# Patient Record
Sex: Male | Born: 1940 | Race: White | Hispanic: No | Marital: Married | Smoking: Former smoker
Health system: Southern US, Community
[De-identification: ages and names within clinical notes are randomized; demographics above are authoritative.]

## PROBLEM LIST (undated history)

## (undated) DIAGNOSIS — N3010 Interstitial cystitis (chronic) without hematuria: Secondary | ICD-10-CM

## (undated) DIAGNOSIS — R0683 Snoring: Principal | ICD-10-CM

## (undated) DIAGNOSIS — E039 Hypothyroidism, unspecified: Secondary | ICD-10-CM

## (undated) DIAGNOSIS — M6289 Other specified disorders of muscle: Secondary | ICD-10-CM

## (undated) HISTORY — DX: Other specified disorders of muscle: M62.89

## (undated) HISTORY — DX: Interstitial cystitis (chronic) without hematuria: N30.10

## (undated) HISTORY — DX: Snoring: R06.83

---

## 1968-03-07 HISTORY — PX: VASECTOMY: SHX75

## 2003-01-29 ENCOUNTER — Ambulatory Visit (HOSPITAL_COMMUNITY): Admission: RE | Admit: 2003-01-29 | Discharge: 2003-01-29 | Payer: Self-pay | Admitting: Family Medicine

## 2004-04-15 ENCOUNTER — Ambulatory Visit (HOSPITAL_COMMUNITY): Admission: RE | Admit: 2004-04-15 | Discharge: 2004-04-15 | Payer: Self-pay | Admitting: Family Medicine

## 2005-01-19 ENCOUNTER — Ambulatory Visit (HOSPITAL_COMMUNITY): Admission: RE | Admit: 2005-01-19 | Discharge: 2005-01-19 | Payer: Self-pay | Admitting: Family Medicine

## 2005-02-03 ENCOUNTER — Ambulatory Visit (HOSPITAL_COMMUNITY): Admission: RE | Admit: 2005-02-03 | Discharge: 2005-02-03 | Payer: Self-pay | Admitting: Family Medicine

## 2008-03-07 HISTORY — PX: MOUTH SURGERY: SHX715

## 2009-03-06 ENCOUNTER — Emergency Department (HOSPITAL_COMMUNITY): Admission: EM | Admit: 2009-03-06 | Discharge: 2009-03-06 | Payer: Self-pay | Admitting: Emergency Medicine

## 2009-03-07 HISTORY — PX: COLONOSCOPY: SHX174

## 2009-08-13 ENCOUNTER — Encounter: Payer: Self-pay | Admitting: Gastroenterology

## 2009-08-21 ENCOUNTER — Ambulatory Visit: Payer: Self-pay | Admitting: Gastroenterology

## 2009-08-21 ENCOUNTER — Ambulatory Visit (HOSPITAL_COMMUNITY)
Admission: RE | Admit: 2009-08-21 | Discharge: 2009-08-21 | Payer: Self-pay | Source: Home / Self Care | Admitting: Gastroenterology

## 2010-04-06 NOTE — Letter (Signed)
Summary: TRIAGE ORDER  TRIAGE ORDER   Imported By: Ave Filter 08/13/2009 13:48:05  _____________________________________________________________________  External Attachment:    Type:   Image     Comment:   External Document

## 2010-07-21 ENCOUNTER — Emergency Department (HOSPITAL_COMMUNITY)
Admission: EM | Admit: 2010-07-21 | Discharge: 2010-07-21 | Disposition: A | Discharge status: Home / Self Care | Payer: Medicare Other | Attending: Emergency Medicine | Admitting: Emergency Medicine

## 2010-07-21 DIAGNOSIS — N390 Urinary tract infection, site not specified: Secondary | ICD-10-CM | POA: Insufficient documentation

## 2010-07-21 DIAGNOSIS — Z79899 Other long term (current) drug therapy: Secondary | ICD-10-CM | POA: Insufficient documentation

## 2010-07-21 DIAGNOSIS — R319 Hematuria, unspecified: Secondary | ICD-10-CM | POA: Insufficient documentation

## 2010-07-21 DIAGNOSIS — E7800 Pure hypercholesterolemia, unspecified: Secondary | ICD-10-CM | POA: Insufficient documentation

## 2010-07-21 LAB — URINALYSIS, ROUTINE W REFLEX MICROSCOPIC
Glucose, UA: NEGATIVE mg/dL
Nitrite: POSITIVE — AB
Protein, ur: >300 mg/dL — AB
Specific Gravity, Urine: 1.025 (ref 1.005–1.030)
Urobilinogen, UA: 1 mg/dL (ref 0.0–1.0)
pH: 6.5 (ref 5.0–8.0)

## 2010-07-21 LAB — URINE MICROSCOPIC-ADD ON

## 2010-07-23 LAB — URINE CULTURE
Colony Count: 4000
Culture  Setup Time: 201205180046

## 2010-07-26 ENCOUNTER — Other Ambulatory Visit (HOSPITAL_COMMUNITY): Payer: Self-pay | Admitting: Internal Medicine

## 2010-07-27 ENCOUNTER — Other Ambulatory Visit (HOSPITAL_COMMUNITY): Payer: Self-pay | Admitting: Urology

## 2010-07-28 ENCOUNTER — Ambulatory Visit (HOSPITAL_COMMUNITY)
Admission: RE | Admit: 2010-07-28 | Discharge: 2010-07-28 | Disposition: A | Discharge status: Home / Self Care | Payer: BC Managed Care – PPO | Source: Ambulatory Visit | Attending: Internal Medicine | Admitting: Internal Medicine

## 2010-07-28 DIAGNOSIS — Q619 Cystic kidney disease, unspecified: Secondary | ICD-10-CM | POA: Insufficient documentation

## 2010-07-28 DIAGNOSIS — R3129 Other microscopic hematuria: Secondary | ICD-10-CM | POA: Insufficient documentation

## 2010-07-28 DIAGNOSIS — J984 Other disorders of lung: Secondary | ICD-10-CM | POA: Insufficient documentation

## 2010-07-28 DIAGNOSIS — K5730 Diverticulosis of large intestine without perforation or abscess without bleeding: Secondary | ICD-10-CM | POA: Insufficient documentation

## 2010-07-28 MED ORDER — IOHEXOL 300 MG/ML  SOLN
100.0000 mL | Freq: Once | INTRAMUSCULAR | Status: AC | PRN
  Administered 2010-07-28: 100 mL via INTRAVENOUS

## 2010-08-19 ENCOUNTER — Encounter (HOSPITAL_COMMUNITY): Payer: BC Managed Care – PPO

## 2010-08-19 ENCOUNTER — Other Ambulatory Visit: Payer: Self-pay | Admitting: Urology

## 2010-08-19 ENCOUNTER — Other Ambulatory Visit: Payer: Self-pay | Admitting: Infectious Diseases

## 2010-08-19 LAB — CBC
HCT: 40.1 % (ref 39.0–52.0)
Hemoglobin: 13.5 g/dL (ref 13.0–17.0)
MCH: 30.3 pg (ref 26.0–34.0)
MCHC: 33.7 g/dL (ref 30.0–36.0)
MCV: 90.1 fL (ref 78.0–100.0)
Platelets: 194 10*3/uL (ref 150–400)
RBC: 4.45 MIL/uL (ref 4.22–5.81)
RDW: 13 % (ref 11.5–15.5)
WBC: 5.7 10*3/uL (ref 4.0–10.5)

## 2010-08-19 LAB — BASIC METABOLIC PANEL
BUN: 33 mg/dL — ABNORMAL HIGH (ref 6–23)
CO2: 27 mEq/L (ref 19–32)
Calcium: 9.9 mg/dL (ref 8.4–10.5)
Chloride: 105 mEq/L (ref 96–112)
Creatinine, Ser: 1.31 mg/dL (ref 0.4–1.5)
GFR calc Af Amer: >60 mL/min (ref >60)
GFR calc non Af Amer: 54 mL/min — ABNORMAL LOW (ref >60)
Glucose, Bld: 99 mg/dL (ref 70–99)
Potassium: 3.9 mEq/L (ref 3.5–5.1)
Sodium: 139 mEq/L (ref 135–145)

## 2010-08-19 LAB — SURGICAL PCR SCREEN
MRSA, PCR: NEGATIVE
Staphylococcus aureus: POSITIVE — AB

## 2010-08-25 ENCOUNTER — Ambulatory Visit (HOSPITAL_COMMUNITY)
Admission: RE | Admit: 2010-08-25 | Discharge: 2010-08-25 | Disposition: A | Discharge status: Home / Self Care | Payer: BC Managed Care – PPO | Source: Ambulatory Visit | Attending: Urology | Admitting: Urology

## 2010-08-25 ENCOUNTER — Other Ambulatory Visit: Payer: Self-pay | Admitting: Urology

## 2010-08-25 DIAGNOSIS — Z01812 Encounter for preprocedural laboratory examination: Secondary | ICD-10-CM | POA: Insufficient documentation

## 2010-08-25 DIAGNOSIS — Z01810 Encounter for preprocedural cardiovascular examination: Secondary | ICD-10-CM | POA: Insufficient documentation

## 2010-08-25 DIAGNOSIS — R310 Gross hematuria: Secondary | ICD-10-CM | POA: Insufficient documentation

## 2010-08-25 DIAGNOSIS — N3010 Interstitial cystitis (chronic) without hematuria: Secondary | ICD-10-CM | POA: Insufficient documentation

## 2010-08-25 NOTE — H&P (Signed)
NAME:  ARTHOR, GORTER NO.:  [id number]  MEDICAL RECORD NO.:  [id number]  LOCATION:  DAY                           FACILITY:  [facility]  PHYSICIAN:  Dennie Maizes, M.D.   DATE OF BIRTH:  [birth year]  DATE OF ADMISSION:  08/25/2010 DATE OF DISCHARGE:  LH                             HISTORY & PHYSICAL   He is coming to the short-stay centre on August 25, 2010.  CHIEF COMPLAINT:  Intermittent gross hematuria.  HISTORY OF PRESENT ILLNESS:  This 70 year old male is referred to me by Dr. Janna Arch for evaluation of intermittent gross hematuria.  The patient experienced hematuria about a month ago.  He was passing blood clots in the urine.  He went to the emergency room at [facility] on Jul 21, 2010.  Urinalysis was suggestive of urinary tract infection.  Urine culture and sensitivity was however negative.  The patient was treated with a course of Cipro.  He was referred to me in office for further evaluation and management.  The patient noticed hematuria for a few days, and he was passing blood clots in the urine.  He did not have any voiding difficulty.  He has urinary frequency times 4-5 and nocturia times 1-2.  There is no history of hematuria, urolithiasis in the past.  PAST MEDICAL HISTORY: 1. Elevated cholesterol. 2. Degenerative disk disease. 3. Chronic right shoulder pain status post left hand surgery in 1949     and 2011. 4. Status post appendectomy in 1943. 5. Status post vasectomy in 1970.  MEDICATIONS:  Pravastatin, tamsulosin, naproxen, and Synthroid.  ALLERGIES:  PENICILLIN and SULFA.  FAMILY HISTORY:  Positive for heart disease, pancreatic cancer, cerebrovascular accident, celiac disease, and intestinal cancer.  SOCIAL HISTORY:  The patient does not smoke.  He drinks socially.  REVIEW OF SYSTEMS:  Positive for drug allergy to PENICILLIN and SULFA, high blood pressure, elevated cholesterol, neck pain due to degenerative disk disease,  painful urination with urinary frequency.  PHYSICAL EXAMINATION:  VITAL SIGNS:  Height 5 feet 10 inches, weight 173 pounds. HEAD, EYES, EAR, NOSE, AND THROAT:  Normal. LUNGS:  Clear to auscultation. HEART:  Regular rate and rhythm.  No murmurs. ABDOMEN:  Soft.  No palpable flat masses.  Bladder is not palpable. RECTAL:  A 30-g size benign prostate.  Urine culture and sensitivity done today in the emergency room was negative for growth.  Urine cytology revealed no evidence of malignant dysplastic cells.  Renal function:  BUN 30, creatinine 1.23.  A CT scan of abdomen and pelvis with and without contrast was done at [facility].  This revealed no evidence of renal mass or calculi.  There was no hydronephrosis.  A 6-mm size cyst was noted in the lower pole of left kidney.  No filling defects are noted in the collecting systems.  Excessive diverticulosis in the descending and sigmoid colon without evidence of diverticulitis was noted.  Left inguinal hernia containing fat was noted.  The patient also had a small lung nodule which is being followed by Dr. Janna Arch.  Cystoscopy was done in the office.  This revealed patches of erythema in the bladder and especially in the  right lateral wall.  This needs further evaluation.  IMPRESSION:  Hematuria, hemorrhagic cystitis or carcinoma in situ.  PLAN:  The patient is advised regarding the diagnosis and management options.  He is brought to the short-stay centre today for cystoscopy and bladder biopsies for further evaluation.  I explained to the patient regarding the diagnosis and operative details, possible treatment outcome, possible risks and complications and he has agreed for the procedure to be done.     Dennie Maizes, M.D.     SK/MEDQ  D:  08/24/2010  T:  08/24/2010  Job:  [id number]  cc:   Melvyn Novas, MD Fax: [phone]  Short-stay centre at [facility]  Electronically Signed by Dennie Maizes  M.D. on 08/25/2010 10:44:54 AM

## 2013-06-06 ENCOUNTER — Ambulatory Visit (INDEPENDENT_AMBULATORY_CARE_PROVIDER_SITE_OTHER): Payer: Medicare Other | Admitting: Neurology

## 2013-06-06 ENCOUNTER — Encounter (INDEPENDENT_AMBULATORY_CARE_PROVIDER_SITE_OTHER): Payer: Self-pay

## 2013-06-06 ENCOUNTER — Encounter: Payer: Self-pay | Admitting: Neurology

## 2013-06-06 VITALS — BP 128/78 | HR 72 | Resp 17 | Ht 69.5 in | Wt 174.0 lb

## 2013-06-06 DIAGNOSIS — R0683 Snoring: Secondary | ICD-10-CM

## 2013-06-06 DIAGNOSIS — R0989 Other specified symptoms and signs involving the circulatory and respiratory systems: Secondary | ICD-10-CM

## 2013-06-06 DIAGNOSIS — R0609 Other forms of dyspnea: Secondary | ICD-10-CM

## 2013-06-06 HISTORY — DX: Snoring: R06.83

## 2013-06-06 NOTE — Progress Notes (Addendum)
[facility]  Provider:  Larey Seat, M D  Referring Provider: Maricela Curet, MD Primary Care Physician:  Jason Curet, MD  Chief Complaint  Patient presents with  . New Evaluation    Room 11  . 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  This 73 y.o. male  Is seen here as a referral from Dr. Lorriane Singleton for a sleep evaluation.  His son -in- law is currently working from the home office. He has noted him snoring so loudly , audible  in the son in [place] office space 1 door away. His son-in-law measured the time between breathtaking and stated that it exceded 25 seconds.   Jason Singleton also reports being treated for pelvic floor dysfunction interstitial cystitis, causing him to have on average for nocturia breaks at night. His bedtime is around 10.30 and wakes up at 5 pm, takes a power nap of 20 minutes in the afternoon. His wife "kicked him out of the marital bedroom"  , due to snoring, about  20 years ago and when traveling, the couple will use separate hotel room.   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 His last panoramic X ray at his dentist's office showed damage. He has seen a chiropractor for TMJ manipulation.  He has retrognathia.   He is not drowsy while driving , but sleeps during cinema movies, when going out with his only grandson.  He watches TV and falls asleep, and struggles to stay awake during conversations. Intermittently he endorsed restless legs, which can prolong his sleep latency.  He routinely wakes up with a dry mouth which could also be related to some of her medications but is a habitual mouth breather .  He has had nasal congestion for most of the year,  he has a narrow nasion and is easily congested.   The patient has a normal body mass index, physically very active he had no recent change in weight  , that could explain the degree of sleepiness.   He  has only been on baclofen for 9-12  month, this may have caused more snoring.   Jason Singleton reports a lot of stress, he renovated his 27 th house for sale, and has been slower in doing this. He uses this as a suplementary income.     Review of Systems: Out of a complete 14 system review, the patient complains of only the following symptoms, and all other reviewed systems are negative: GDS 4 points,  FSS 24, Epworth 13 .  History   Social History  . Marital Status: Married    Spouse Name: Jason Singleton    Number of Children: 4  . Years of Education: Masters +   Occupational History  . Not on file.   Social History Main Topics  . Smoking status: Former Research scientist (life sciences)  . Smokeless tobacco: Never Used     Comment: Quit in 1967  . Alcohol Use: Yes     Comment: 3 glasses of wine weekly  . Drug Use: No     Comment: Marijuana in the past  . Sexual Activity: Not on file   Other Topics Concern  . Not on file   Social History Narrative   Patient is married Jason Singleton) and lives at home with his wife, daughter, son-in-law and grandson.   Patient is retired.   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 (Three children are adopted)   Patient drinks 3/4 cups of coffee daily.          Family History  Problem Relation Age of Onset  . Pancreatic cancer Mother   . Stroke Father   . Celiac disease Sister   . Celiac disease Sister   . Celiac disease Brother   . Heart attack Mother   . Heart attack Father     Past Medical History  Diagnosis Date  . Interstitial cystitis   . Pelvic floor dysfunction   . Primary snoring 06/06/2013    Patient's relatives witnessed thunderous snoring, and pauses in breathing during sleep. 04-2013     Past Surgical History  Procedure Laterality Date  . Vasectomy  1970  . Mouth surgery  2010    Current Outpatient Prescriptions  Medication Sig Dispense Refill  . baclofen (LIORESAL) 20 MG tablet 1 tablet 2 (two)  times daily.      Marland Kitchen ELMIRON 100 MG capsule 2 capsules 2 (two) times daily.      Marland Kitchen levothyroxine (SYNTHROID, LEVOTHROID) 75 MCG tablet 1 tablet daily.      . tamsulosin (FLOMAX) 0.4 MG CAPS capsule 1 capsule 2 (two) times daily.       No current facility-administered medications for this visit.    Allergies as of 06/06/2013 - Review Complete 06/06/2013  Allergen Reaction Noted  . Penicillins  06/06/2013  . Sulfa antibiotics Rash 06/06/2013    Vitals: BP 128/78  Pulse 72  Resp 17  Ht 5' 9.5" (1.765 m)  Wt 174 lb (78.926 kg)  BMI 25.34 kg/m2 Last Weight:  Wt Readings from Last 1 Encounters:  06/06/13 174 lb (78.926 kg)   Last Height:   Ht Readings from Last 1 Encounters:  06/06/13 5' 9.5" (1.765 m)    Physical exam:  General: The patient is awake, alert and appears not in acute distress. The patient is well groomed. Head: Normocephalic, atraumatic. Neck is supple. Mallampati 3, neck circumference: 15.5 inches.  Narrow nasion, left side had epistaxis, dry mouth.  Cardiovascular:  Regular rate and rhythm , without  murmurs or carotid bruit, and without distended neck veins. Respiratory: Lungs are clear to auscultation. Skin:  Without evidence of edema, or rash. No edema. Trunk: BMI is normal.  Neurologic exam : The patient is awake and alert, oriented to place and time.  Memory subjective described as intact.  There is a normal attention span & concentration ability. Speech is fluent without dysarthria, dysphonia or aphasia. Mood and affect are appropriate.  Cranial nerves: Pupils are equal and briskly reactive to light. Funduscopic exam without  evidence of pallor or edema.  Extraocular movements  in vertical and horizontal planes intact and without nystagmus. Visual fields by finger perimetry are intact. Hearing to finger rub intact.  Facial sensation intact to fine touch. Facial motor strength is symmetric and tongue and uvula move midline.  Motor exam: Normal tone and  normal muscle bulk and symmetric normal strength in all extremities.  Sensory:  Fine touch, pinprick and vibration were tested in all extremities. Proprioception is  normal.  Coordination: Rapid alternating movements in the fingers/hands is tested and normal.  Finger-to-nose maneuver tested and normal without evidence of ataxia, dysmetria or tremor.  Gait and station: Patient walks without assistive device and is able and assisted stool climb up to the exam table. Strength within normal limits. Stance is stable and normal. Tandem gait is  unfragmented. Romberg testing is normal.  Deep tendon reflexes: in the  upper and lower extremities are symmetric and intact. Babinski maneuver downgoing.   Assessment:  After physical and neurologic examination, review of laboratory studies, imaging, neurophysiology testing and pre-existing records, assessment is   1) Patient with loud snoring, witnessed apnea. OSA likely given the mouth breathing habit and snoring. 2) Epistaxis . 3) Mild retrognathia , but normal BMI. Nasal congestion.  4) nocturia   Plan:  Treatment plan and additional workup : SPLIT study order, encourage supine sleep in the lab, AHI 15 and score at 4%. This patient may benefit from dental device, in light of his retrognathia and nasal congestion.  The baclofen is used for pelvic floor dysfunction, cystitis. This medication may contribute more to snoring. Nocturia may be partially caused by OSA>

## 2013-06-06 NOTE — Patient Instructions (Signed)
Hypersomnia Hypersomnia usually brings recurrent episodes of excessive daytime sleepiness or prolonged nighttime sleep. It is different than feeling tired due to lack of or interrupted sleep at night. People with hypersomnia are compelled to nap repeatedly during the day. This is often at inappropriate times such as:  At work.  During a meal.  In conversation. These daytime naps usually provide no relief. This disorder typically affects adolescents and young adults. CAUSES  This condition may be caused by:  Another sleep disorder (such as narcolepsy or sleep apnea).  Dysfunction of the autonomic nervous system.  Drug or alcohol abuse.  A physical problem, such as:  A tumor.  Head trauma. This is damage caused by an accident.  Injury to the central nervous system.  Certain medications, or medicine withdrawal.  Medical conditions may contribute to the disorder, including:  Multiple sclerosis.  Depression.  Encephalitis.  Epilepsy.  Obesity.  Some people appear to have a genetic predisposition to this disorder. In others, there is no known cause. SYMPTOMS   Patients often have difficulty waking from a long sleep. They may feel dazed or confused.  Other symptoms may include:  Anxiety.  Increased irritation (inflammation).  Decreased energy.  Restlessness.  Slow thinking.  Slow speech.  Loss of appetite.  Hallucinations.  Memory difficulty.  Tremors, Tics.  Some patients lose the ability to function in family, social, occupational, or other settings. TREATMENT  Treatment is symptomatic in nature. Stimulants and other drugs may be used to treat this disorder. Changes in behavior may help. For example, avoid night work and social activities that delay bed time. Changes in diet may offer some relief. Patients should avoid alcohol and caffeine. PROGNOSIS  The likely outcome (prognosis) for persons with hypersomnia depends on the cause of the disorder.  The disorder itself is not life threatening. But it can have serious consequences. For example, automobile accidents can be caused by falling asleep while driving. The attacks usually continue indefinitely. Document Released: 02/11/2002 Document Revised: 05/16/2011 Document Reviewed: 01/16/2008 ExitCare Patient Information 2014 [employer].  

## 2013-07-05 ENCOUNTER — Ambulatory Visit (INDEPENDENT_AMBULATORY_CARE_PROVIDER_SITE_OTHER): Payer: Medicare Other

## 2013-07-05 VITALS — BP 123/58

## 2013-07-05 DIAGNOSIS — R0683 Snoring: Secondary | ICD-10-CM

## 2013-07-05 DIAGNOSIS — R0989 Other specified symptoms and signs involving the circulatory and respiratory systems: Secondary | ICD-10-CM

## 2013-07-05 DIAGNOSIS — R0609 Other forms of dyspnea: Secondary | ICD-10-CM

## 2013-07-09 ENCOUNTER — Encounter: Payer: Self-pay | Admitting: *Deleted

## 2013-07-09 ENCOUNTER — Telehealth: Payer: Self-pay | Admitting: Neurology

## 2013-07-09 NOTE — Telephone Encounter (Signed)
I called and left a message for the patient about his recent sleep study results. I informed the patient that the study did not reveal evidence of significant central or obstructive sleep apnea. The study did reveal hypoxema and mild UARS but only when in supine position. Dr. Brett Fairy recommend positional therapy to reduce the finding. I will fax a copy of the report to Dr. Denita Lung office and mail a copy to the patient.

## 2014-07-01 ENCOUNTER — Other Ambulatory Visit: Payer: Self-pay

## 2014-07-01 ENCOUNTER — Encounter: Payer: Self-pay | Admitting: Nurse Practitioner

## 2014-07-01 ENCOUNTER — Ambulatory Visit (INDEPENDENT_AMBULATORY_CARE_PROVIDER_SITE_OTHER): Payer: Medicare Other | Admitting: Nurse Practitioner

## 2014-07-01 VITALS — BP 118/67 | HR 70 | Temp 97.4°F | Ht 70.0 in | Wt 174.8 lb

## 2014-07-01 DIAGNOSIS — Z85038 Personal history of other malignant neoplasm of large intestine: Secondary | ICD-10-CM

## 2014-07-01 DIAGNOSIS — Z800 Family history of malignant neoplasm of digestive organs: Secondary | ICD-10-CM | POA: Diagnosis not present

## 2014-07-01 DIAGNOSIS — K5730 Diverticulosis of large intestine without perforation or abscess without bleeding: Secondary | ICD-10-CM | POA: Diagnosis not present

## 2014-07-01 MED ORDER — PEG-KCL-NACL-NASULF-NA ASC-C 100 G PO SOLR: 1.0000 | ORAL | Status: DC

## 2014-07-01 NOTE — Progress Notes (Signed)
Primary Care Physician:  Maricela Curet, MD Primary Gastroenterologist:  Dr. Oneida Alar  Chief Complaint  Patient presents with  . set up TCS    HPI:   74 year old male presents for visit or a 5 year screening colonoscopy. Last colonoscopy by Dr. Oneida Alar on 08/21/2009 found rare ascending colon diverticula, rare descending colon diverticula, frequent sigmoid: Diverticula associated with hypertrophy folds. No polyps masses AVMs or inflammatory changes. Small internal hemorrhoids. Recommend screening in 5 years due to severe sigmoid diverticulosis and hypertrophy folds which increased risk for missing a polyp less than 5 mm. Of note his family history is pertinent that his sister had colon cancer at an age greater than 66.  Today he states his sister died at age 17 from colon cancer. Had had no abdominal pain, hematochezia, melena, fever, chills, unintentional weight loss. Denies any other upper or lower GI symptoms. Watches his diet closely to prevent diverticular disease.  Past Medical History  Diagnosis Date  . Interstitial cystitis   . Pelvic floor dysfunction   . Primary snoring 06/06/2013    Patient's relatives witnessed thunderous snoring, and pauses in breathing during sleep. 04-2013     Past Surgical History  Procedure Laterality Date  . Vasectomy  1970  . Mouth surgery  2010  . Colonoscopy  2011    SLF: 1. Rare ascending colon diverticula. Rare descending colon diverticula. Frequent sigmoid colon diverticula. The diverticula wer associated. with hypertrophy folds. Otherwise no polyps, masses, AVMs or inflammatory changes. 2. Small internal hemorrhoids. Otherwise, normal retroflexed view of the rectum.     Current Outpatient Prescriptions  Medication Sig Dispense Refill  . baclofen (LIORESAL) 20 MG tablet 1 tablet 2 (two) times daily.    Marland Kitchen levothyroxine (SYNTHROID, LEVOTHROID) 75 MCG tablet 1 tablet daily.    . OnabotulinumtoxinA (BOTOX IJ) Inject as directed. Treatment for  bladder sphincter as needed    . tamsulosin (FLOMAX) 0.4 MG CAPS capsule 1 capsule as needed.     Marland Kitchen ELMIRON 100 MG capsule 2 capsules 2 (two) times daily.     No current facility-administered medications for this visit.    Allergies as of 07/01/2014 - Review Complete 07/01/2014  Allergen Reaction Noted  . Penicillins  06/06/2013  . Sulfa antibiotics Rash 06/06/2013    Family History  Problem Relation Age of Onset  . Pancreatic cancer Mother   . Stroke Father   . Celiac disease Sister   . Celiac disease Sister   . Celiac disease Brother   . Heart attack Mother   . Heart attack Father     History   Social History  . Marital Status: Married    Spouse Name: [name]  . Number of Children: 4  . Years of Education: Masters +   Occupational History  . Not on file.   Social History Main Topics  . Smoking status: Former Research scientist (life sciences)  . Smokeless tobacco: Never Used     Comment: Quit in 1967  . Alcohol Use: Yes     Comment: 3 glasses of wine weekly  . Drug Use: No     Comment: Marijuana in the past  . Sexual Activity: Not on file   Other Topics Concern  . Not on file   Social History Narrative   Patient is married [name]) and lives at home with his wife, daughter, son-in-law and grandson.   Patient is retired.   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 (Three children are adopted)   Patient drinks 3/4 cups of coffee daily.          Review of Systems: General: Negative for anorexia, weight loss, fever, chills, fatigue, weakness. Eyes: Negative for vision changes.  ENT: Negative for hoarseness, difficulty swallowing. CV: Negative for chest pain, angina, palpitations, peripheral edema.  Respiratory: Negative for dyspnea at rest, cough, sputum, wheezing.  GI: See history of present illness. MS: Negative for joint pain, low back pain.  Derm: Negative for rash or itching.  Neuro: Negative for weakness, seizure, memory loss,  confusion.  Psych: Negative for anxiety, depression.  Endo: Negative for unusual weight change.  Heme: Negative for bruising or bleeding. Allergy: Negative for rash or hives.    Physical Exam: BP 118/67 mmHg  Pulse 70  Temp(Src) 97.4 F (36.3 C)  Ht 5\' 10"  (1.778 m)  Wt 174 lb 12.8 oz (79.289 kg)  BMI 25.08 kg/m2 General:   Alert and oriented. Pleasant and cooperative. Well-nourished and well-developed.  Head:  Normocephalic and atraumatic. Eyes:  Without icterus, sclera clear and conjunctiva pink.  Ears:  Normal auditory acuity. Mouth:  No deformity or lesions, oral mucosa pink. No OP edema. Neck:  Supple, without mass or thyromegaly. Lungs:  Clear to auscultation bilaterally. No wheezes, rales, or rhonchi. No distress.  Heart:  S1, S2 present without murmurs appreciated.  Abdomen:  +BS, soft, non-tender and non-distended. No HSM noted. No guarding or rebound. No masses appreciated.  Rectal:  Deferred  Msk:  Symmetrical without gross deformities. Normal posture. Extremities:  Without clubbing or edema. Neurologic:  Alert and  oriented x4;  grossly normal neurologically. Skin:  Intact without significant lesions or rashes. Cervical Nodes:  No significant cervical adenopathy. Psych:  Alert and cooperative. Normal mood and affect.     07/01/2014 11:12 AM

## 2014-07-01 NOTE — Patient Instructions (Signed)
1. We will schedule you for your procedure (colonoscopy) for you. 2. Further recommendations to be based on results are procedure.

## 2014-07-07 NOTE — Assessment & Plan Note (Signed)
Patient with extensive diverticular disease including rare ascending and descending colon diverticula. However has frequent sigmoid diverticula associated with hypertrophy folds which made him at increased risk for missing a small polyp. Last colonoscopy 5 years ago, recommended 5 year repeat due to risk of missing small polyp and hypertrophy folds of sigmoid diverticulum. Currently asymptomatic, no hematochezia, melena, fever, chills, abdominal pain, or any other red flag/warning signs or symptoms.  Proceed with colonoscopy with Dr. Oneida Alar in the near future. The risks, benefits, and alternatives have been discussed in detail with the patient. They state understanding and desire to proceed.   Patient is not on any antidiabetic medications, anticoagulants, or chronic pain medications. Social alcohol consumption, remote history of marijuana use.

## 2014-07-07 NOTE — Progress Notes (Signed)
cc'ed to pcp °

## 2014-07-07 NOTE — Assessment & Plan Note (Addendum)
73 old male with a pertinent family history of colon cancer, his sister died at age 75 from colon cancer. Last colonoscopy 5 years ago found rare ascending colon diverticula, rare descending colon diverticula, frequent sigmoid diverticula. Recommended 5 year surveillance due to severe sigmoid diverticular disease and hypertrophy folds which increase the risk for missing a small polyp. Currently asymptomatic. No red flag/warning signs or symptoms.  Proceed with colonoscopy with Dr. Oneida Alar in the near future. The risks, benefits, and alternatives have been discussed in detail with the patient. They state understanding and desire to proceed.   Patient is not on any chronic pain medicines, anticoagulants, or diabetes medications. Admits social alcohol consumption, remote marijuana use in the past.

## 2014-07-17 ENCOUNTER — Encounter: Payer: Self-pay | Admitting: Gastroenterology

## 2014-07-22 ENCOUNTER — Ambulatory Visit (HOSPITAL_COMMUNITY)
Admission: RE | Admit: 2014-07-22 | Discharge: 2014-07-22 | Disposition: A | Discharge status: Home / Self Care | Payer: Medicare Other | Source: Ambulatory Visit | Attending: Gastroenterology | Admitting: Gastroenterology

## 2014-07-22 ENCOUNTER — Encounter (HOSPITAL_COMMUNITY): Payer: Self-pay | Admitting: *Deleted

## 2014-07-22 ENCOUNTER — Encounter (HOSPITAL_COMMUNITY)
Admission: RE | Disposition: A | Discharge status: Home / Self Care | Payer: Self-pay | Source: Ambulatory Visit | Attending: Gastroenterology

## 2014-07-22 DIAGNOSIS — E039 Hypothyroidism, unspecified: Secondary | ICD-10-CM | POA: Diagnosis not present

## 2014-07-22 DIAGNOSIS — Z880 Allergy status to penicillin: Secondary | ICD-10-CM | POA: Insufficient documentation

## 2014-07-22 DIAGNOSIS — K648 Other hemorrhoids: Secondary | ICD-10-CM | POA: Diagnosis not present

## 2014-07-22 DIAGNOSIS — D125 Benign neoplasm of sigmoid colon: Secondary | ICD-10-CM

## 2014-07-22 DIAGNOSIS — Z1211 Encounter for screening for malignant neoplasm of colon: Secondary | ICD-10-CM | POA: Diagnosis not present

## 2014-07-22 DIAGNOSIS — D120 Benign neoplasm of cecum: Secondary | ICD-10-CM | POA: Insufficient documentation

## 2014-07-22 DIAGNOSIS — Z800 Family history of malignant neoplasm of digestive organs: Secondary | ICD-10-CM | POA: Insufficient documentation

## 2014-07-22 DIAGNOSIS — Z882 Allergy status to sulfonamides status: Secondary | ICD-10-CM | POA: Insufficient documentation

## 2014-07-22 DIAGNOSIS — Z9852 Vasectomy status: Secondary | ICD-10-CM | POA: Insufficient documentation

## 2014-07-22 DIAGNOSIS — K5730 Diverticulosis of large intestine without perforation or abscess without bleeding: Secondary | ICD-10-CM | POA: Diagnosis not present

## 2014-07-22 DIAGNOSIS — Z85038 Personal history of other malignant neoplasm of large intestine: Secondary | ICD-10-CM

## 2014-07-22 HISTORY — PX: COLONOSCOPY: SHX5424

## 2014-07-22 HISTORY — DX: Hypothyroidism, unspecified: E03.9

## 2014-07-22 SURGERY — COLONOSCOPY
Anesthesia: Moderate Sedation

## 2014-07-22 MED ORDER — MIDAZOLAM HCL 5 MG/5ML IJ SOLN
INTRAMUSCULAR | Status: AC
  Filled 2014-07-22: qty 10

## 2014-07-22 MED ORDER — STERILE WATER FOR IRRIGATION IR SOLN
Status: DC | PRN
  Administered 2014-07-22: 12:00:00

## 2014-07-22 MED ORDER — MEPERIDINE HCL 100 MG/ML IJ SOLN
INTRAMUSCULAR | Status: DC | PRN
  Administered 2014-07-22 (×2): 25 mg via INTRAVENOUS

## 2014-07-22 MED ORDER — MEPERIDINE HCL 100 MG/ML IJ SOLN
INTRAMUSCULAR | Status: AC
  Filled 2014-07-22: qty 2

## 2014-07-22 MED ORDER — MIDAZOLAM HCL 5 MG/5ML IJ SOLN
INTRAMUSCULAR | Status: DC | PRN
  Administered 2014-07-22: 2 mg via INTRAVENOUS
  Administered 2014-07-22: 1 mg via INTRAVENOUS

## 2014-07-22 MED ORDER — SODIUM CHLORIDE 0.9 % IV SOLN
INTRAVENOUS | Status: DC
  Administered 2014-07-22: 11:00:00 via INTRAVENOUS

## 2014-07-22 NOTE — Discharge Instructions (Signed)
You had 2 small polyps removed. You have MODERATE internal hemorrhoids and diverticulosis IN YOUR LEFT COLON.   FOLLOW A HIGH FIBER DIET. AVOID ITEMS THAT CAUSE BLOATING. SEE INFO BELOW.  YOUR BIOPSY RESULTS WILL BE AVAILABLE IN MY CHART AFTER MAY 19 AND MY OFFICE WILL CONTACT YOU IN 10-14 DAYS WITH YOUR RESULTS.   Next colonoscopy in 5-10 years.   Colonoscopy Care After Read the instructions outlined below and refer to this sheet in the next week. These discharge instructions provide you with general information on caring for yourself after you leave the hospital. While your treatment has been planned according to the most current medical practices available, unavoidable complications occasionally occur. If you have any problems or questions after discharge, call DR. Azlee Monforte, [phone].  ACTIVITY  You may resume your regular activity, but move at a slower pace for the next 24 hours.   Take frequent rest periods for the next 24 hours.   Walking will help get rid of the air and reduce the bloated feeling in your belly (abdomen).   No driving for 24 hours (because of the medicine (anesthesia) used during the test).   You may shower.   Do not sign any important legal documents or operate any machinery for 24 hours (because of the anesthesia used during the test).    NUTRITION  Drink plenty of fluids.   You may resume your normal diet as instructed by your doctor.   Begin with a light meal and progress to your normal diet. Heavy or fried foods are harder to digest and may make you feel sick to your stomach (nauseated).   Avoid alcoholic beverages for 24 hours or as instructed.    MEDICATIONS  You may resume your normal medications.   WHAT YOU CAN EXPECT TODAY  Some feelings of bloating in the abdomen.   Passage of more gas than usual.   Spotting of blood in your stool or on the toilet paper  .  IF YOU HAD POLYPS REMOVED DURING THE COLONOSCOPY:  Eat a soft diet IF  YOU HAVE NAUSEA, BLOATING, ABDOMINAL PAIN, OR VOMITING.    FINDING OUT THE RESULTS OF YOUR TEST Not all test results are available during your visit. DR. Oneida Alar WILL CALL YOU WITHIN 14 DAYS OF YOUR PROCEDUE WITH YOUR RESULTS. Do not assume everything is normal if you have not heard from DR. Cleophus Mendonsa, CALL HER OFFICE AT [phone].  SEEK IMMEDIATE MEDICAL ATTENTION AND CALL THE OFFICE: [phone] IF:  You have more than a spotting of blood in your stool.   Your belly is swollen (abdominal distention).   You are nauseated or vomiting.   You have a temperature over 101F.   You have abdominal pain or discomfort that is severe or gets worse throughout the day.  Polyps, Colon  A polyp is extra tissue that grows inside your body. Colon polyps grow in the large intestine. The large intestine, also called the colon, is part of your digestive system. It is a long, hollow tube at the end of your digestive tract where your body makes and stores stool. Most polyps are not dangerous. They are benign. This means they are not cancerous. But over time, some types of polyps can turn into cancer. Polyps that are smaller than a pea are usually not harmful. But larger polyps could someday become or may already be cancerous. To be safe, doctors remove all polyps and test them.   PREVENTION There is not one sure way to prevent  polyps. You might be able to lower your risk of getting them if you:  Eat more fruits and vegetables and less fatty food.   Do not smoke.   Avoid alcohol.   Exercise every day.   Lose weight if you are overweight.   Eating more calcium and folate can also lower your risk of getting polyps. Some foods that are rich in calcium are milk, cheese, and broccoli. Some foods that are rich in folate are chickpeas, kidney beans, and spinach.   High-Fiber Diet A high-fiber diet changes your normal diet to include more whole grains, legumes, fruits, and vegetables. Changes in the diet  involve replacing refined carbohydrates with unrefined foods. The calorie level of the diet is essentially unchanged. The Dietary Reference Intake (recommended amount) for adult males is 38 grams per day. For adult females, it is 25 grams per day. Pregnant and lactating women should consume 28 grams of fiber per day. Fiber is the intact part of a plant that is not broken down during digestion. Functional fiber is fiber that has been isolated from the plant to provide a beneficial effect in the body. PURPOSE  Increase stool bulk.   Ease and regulate bowel movements.   Lower cholesterol.  INDICATIONS THAT YOU NEED MORE FIBER  Constipation and hemorrhoids.   Uncomplicated diverticulosis (intestine condition) and irritable bowel syndrome.   Weight management.   As a protective measure against hardening of the arteries (atherosclerosis), diabetes, and cancer.   GUIDELINES FOR INCREASING FIBER IN THE DIET  Start adding fiber to the diet slowly. A gradual increase of about 5 more grams (2 slices of whole-wheat bread, 2 servings of most fruits or vegetables, or 1 bowl of high-fiber cereal) per day is best. Too rapid an increase in fiber may result in constipation, flatulence, and bloating.   Drink enough water and fluids to keep your urine clear or pale yellow. Water, juice, or caffeine-free drinks are recommended. Not drinking enough fluid may cause constipation.   Eat a variety of high-fiber foods rather than one type of fiber.   Try to increase your intake of fiber through using high-fiber foods rather than fiber pills or supplements that contain small amounts of fiber.   The goal is to change the types of food eaten. Do not supplement your present diet with high-fiber foods, but replace foods in your present diet.    INCLUDE A VARIETY OF FIBER SOURCES  Replace refined and processed grains with whole grains, canned fruits with fresh fruits, and incorporate other fiber sources. White rice,  white breads, and most bakery goods contain little or no fiber.   Brown whole-grain rice, buckwheat oats, and many fruits and vegetables are all good sources of fiber. These include: broccoli, Brussels sprouts, cabbage, cauliflower, beets, sweet potatoes, white potatoes (skin on), carrots, tomatoes, eggplant, squash, berries, fresh fruits, and dried fruits.   Cereals appear to be the richest source of fiber. Cereal fiber is found in whole grains and bran. Bran is the fiber-rich outer coat of cereal grain, which is largely removed in refining. In whole-grain cereals, the bran remains. In breakfast cereals, the largest amount of fiber is found in those with "bran" in their names. The fiber content is sometimes indicated on the label.   You may need to include additional fruits and vegetables each day.   In baking, for 1 cup white flour, you may use the following substitutions:   1 cup whole-wheat flour minus 2 tablespoons.   1/2 cup  white flour plus 1/2 cup whole-wheat flour.   Diverticulosis Diverticulosis is a common condition that develops when small pouches (diverticula) form in the wall of the colon. The risk of diverticulosis increases with age. It happens more often in people who eat a low-fiber diet. Most individuals with diverticulosis have no symptoms. Those individuals with symptoms usually experience belly (abdominal) pain, constipation, or loose stools (diarrhea).  HOME CARE INSTRUCTIONS  Increase the amount of fiber in your diet as directed by your caregiver or dietician. This may reduce symptoms of diverticulosis.   Drink at least 6 to 8 glasses of water each day to prevent constipation.   Try not to strain when you have a bowel movement.   Avoiding nuts and seeds to prevent complications is NOT NEEDED.t  FOODS HAVING HIGH FIBER CONTENT INCLUDE:  Fruits. Apple, peach, pear, tangerine, raisins, prunes.   Vegetables. Brussels sprouts, asparagus, broccoli, cabbage, carrot,  cauliflower, romaine lettuce, spinach, summer squash, tomato, winter squash, zucchini.   Starchy Vegetables. Baked beans, kidney beans, lima beans, split peas, lentils, potatoes (with skin).   Grains. Whole wheat bread, brown rice, bran flake cereal, plain oatmeal, white rice, shredded wheat, bran muffins.    SEEK IMMEDIATE MEDICAL CARE IF:  You develop increasing pain or severe bloating.   You have an oral temperature above 101F.   You develop vomiting or bowel movements that are bloody or black.   Hemorrhoids Hemorrhoids are dilated (enlarged) veins around the rectum. Sometimes clots will form in the veins. This makes them swollen and painful. These are called thrombosed hemorrhoids. Causes of hemorrhoids include:  Constipation.   Straining to have a bowel movement.   HEAVY LIFTING  HOME CARE INSTRUCTIONS  Eat a well balanced diet and drink 6 to 8 glasses of water every day to avoid constipation. You may also use a bulk laxative.   Avoid straining to have bowel movements.   Keep anal area dry and clean.   Do not use a donut shaped pillow or sit on the toilet for long periods. This increases blood pooling and pain.   Move your bowels when your body has the urge; this will require less straining and will decrease pain and pressure.

## 2014-07-22 NOTE — Progress Notes (Signed)
REVIEWED-NO ADDITIONAL RECOMMENDATIONS. 

## 2014-07-22 NOTE — H&P (Signed)
Primary Care Physician:  Maricela Curet, MD Primary Gastroenterologist:  Dr. Oneida Alar  Pre-Procedure History & Physical: HPI:  Jason Singleton is a 74 y.o. male here for FAMILY Hx COLON CA-sister HAD COLON CA AGE > 70.  Past Medical History  Diagnosis Date  . Interstitial cystitis   . Pelvic floor dysfunction   . Primary snoring 06/06/2013    Patient's relatives witnessed thunderous snoring, and pauses in breathing during sleep. 04-2013   . Hypothyroidism     Past Surgical History  Procedure Laterality Date  . Vasectomy  1970  . Mouth surgery  2010  . Colonoscopy  2011    SLF: 1. Rare ascending colon diverticula. Rare descending colon diverticula. Frequent sigmoid colon diverticula. The diverticula wer associated. with hypertrophy folds. Otherwise no polyps, masses, AVMs or inflammatory changes. 2. Small internal hemorrhoids. Otherwise, normal retroflexed view of the rectum.     Prior to Admission medications   Medication Sig Start Date End Date Taking? Authorizing Provider  baclofen (LIORESAL) 20 MG tablet 1 tablet 2 (two) times daily. 03/29/13  Yes Historical Provider, MD  levothyroxine (SYNTHROID, LEVOTHROID) 75 MCG tablet 1 tablet daily. 04/20/13  Yes Historical Provider, MD  OnabotulinumtoxinA (BOTOX IJ) Inject as directed. Treatment for bladder sphincter as needed   Yes Historical Provider, MD  peg 3350 powder (MOVIPREP) 100 G SOLR Take 1 kit (200 g total) by mouth as directed. 07/01/14  Yes Danie Binder, MD  tamsulosin (FLOMAX) 0.4 MG CAPS capsule 1 capsule as needed.  06/02/13   Historical Provider, MD    Allergies as of 07/01/2014 - Review Complete 07/01/2014  Allergen Reaction Noted  . Penicillins  06/06/2013  . Sulfa antibiotics Rash 06/06/2013    Family History  Problem Relation Age of Onset  . Pancreatic cancer Mother   . Stroke Father   . Celiac disease Sister   . Celiac disease Sister   . Celiac disease Brother   . Heart attack Mother   . Heart attack  Father     History   Social History  . Marital Status: Married    Spouse Name: Jason Singleton  . Number of Children: 4  . Years of Education: Masters +   Occupational History  . Not on file.   Social History Main Topics  . Smoking status: Former Research scientist (life sciences)  . Smokeless tobacco: Never Used     Comment: Quit in 1967  . Alcohol Use: Yes     Comment: 3 glasses of wine weekly  . Drug Use: No     Comment: Marijuana in the past  . Sexual Activity: Not on file   Other Topics Concern  . Not on file   Social History Narrative   Patient is married Jason Singleton) and lives at home with his wife, daughter, son-in-law and grandson.   Patient is retired.   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 (Three children are adopted)   Patient drinks 3/4 cups of coffee daily.          Review of Systems: See HPI, otherwise negative ROS   Physical Exam: BP 122/76 mmHg  Pulse 74  Temp(Src) 97.6 F (36.4 C) (Oral)  Resp 18  SpO2 98% General:   Alert,  pleasant and cooperative in NAD Head:  Normocephalic and atraumatic. Neck:  Supple; Lungs:  Clear throughout to auscultation.    Heart:  Regular rate and rhythm. Abdomen:  Soft, nontender and nondistended. Normal bowel sounds,  without guarding, and without rebound.   Neurologic:  Alert and  oriented x4;  grossly normal neurologically.  Impression/Plan:    FAMILY Hx COLON CA-sister HAD COLON CA AGE > 60.  Plan: 1. TCS TODAY

## 2014-07-22 NOTE — Op Note (Signed)
[facility] [address] [place], [address]   COLONOSCOPY PROCEDURE REPORT  PATIENT: Jason, Singleton  MR#: [id number] BIRTHDATE: November 17, 1940 , 53  yrs. old GENDER: male ENDOSCOPIST: Danie Binder, MD REFERRED WL:[id number] Cindie Laroche, M.D. PROCEDURE DATE:  2014/08/04 PROCEDURE:   Colonoscopy with cold biopsy polypectomy INDICATIONS:patient's immediate family history of colon cancer.-SISTER AGE > 60(DECEASED) MEDICATIONS: Demerol 50 mg IV and Versed 3 mg IV  DESCRIPTION OF PROCEDURE:    Physical exam was performed.  Informed consent was obtained from the patient after explaining the benefits, risks, and alternatives to procedure.  The patient was connected to monitor and placed in left lateral position. Continuous oxygen was provided by nasal cannula and IV medicine administered through an indwelling cannula.  After administration of sedation and rectal exam, the patients rectum was intubated and the EC-3890Li ([id number])  colonoscope was advanced under direct visualization to the cecum.  The scope was removed slowly by carefully examining the color, texture, anatomy, and integrity mucosa on the way out.  The patient was recovered in endoscopy and discharged home in satisfactory condition.    COLON FINDINGS: Two sessile polyps ranging from 2 to 22mm in size were found in the sigmoid colon and at the cecum.  A polypectomy was performed with cold forceps.  , There was moderate diverticulosis noted in the sigmoid colon and descending colon with associated muscular hypertrophy and tortuosity.  , and Moderate sized internal hemorrhoids were found.  PREP QUALITY: good.  CECAL W/D TIME: 14       minutes COMPLICATIONS: None  ENDOSCOPIC IMPRESSION: 1.   Two COLON POLYPS REMOVED 2.   Moderate diverticulosis in the sigmoid colon and descending colon 3.   Moderate sized internal hemorrhoids  RECOMMENDATIONS: FOLLOW A HIGH FIBER DIET.  AVOID ITEMS THAT CAUSE  BLOATING. AWAIT BIOPSY RESULTS. Next colonoscopy in 5-10 years.       eSigned:  Danie Binder, MD 08/04/14 3:13 PM    CPT CODES: ICD CODES:  The ICD and CPT codes recommended by this software are interpretations from the data that the clinical staff has captured with the software.  The verification of the translation of this report to the ICD and CPT codes and modifiers is the sole responsibility of the health care institution and practicing physician where this report was generated.  [employer]. will not be held responsible for the validity of the ICD and CPT codes included on this report.  AMA assumes no liability for data contained or not contained herein. CPT is a Designer, television/film set of the [employer].

## 2014-07-23 ENCOUNTER — Encounter (HOSPITAL_COMMUNITY): Payer: Self-pay | Admitting: Gastroenterology

## 2014-07-24 ENCOUNTER — Telehealth: Payer: Self-pay | Admitting: Gastroenterology

## 2014-07-24 ENCOUNTER — Encounter: Payer: Self-pay | Admitting: Gastroenterology

## 2014-07-24 NOTE — Telephone Encounter (Signed)
Please call pt. HE had simple adenomas removed from HIS colon.   FOLLOW A HIGH FIBER DIET.  NEXT TCS IN 5-10 YEARS IF THE BENEFITS OUTWEIGH THE RISKS.

## 2014-07-24 NOTE — Telephone Encounter (Signed)
LMOM to call.

## 2014-07-24 NOTE — Telephone Encounter (Signed)
REMINDER IN EPIC °

## 2014-07-24 NOTE — Telephone Encounter (Signed)
Pt is aware of results. 

## 2014-09-30 ENCOUNTER — Other Ambulatory Visit (HOSPITAL_COMMUNITY): Payer: Self-pay | Admitting: Family Medicine

## 2014-09-30 ENCOUNTER — Ambulatory Visit (HOSPITAL_COMMUNITY)
Admission: RE | Admit: 2014-09-30 | Discharge: 2014-09-30 | Disposition: A | Discharge status: Home / Self Care | Payer: Medicare Other | Source: Ambulatory Visit | Attending: Family Medicine | Admitting: Family Medicine

## 2014-09-30 DIAGNOSIS — M1611 Unilateral primary osteoarthritis, right hip: Secondary | ICD-10-CM

## 2014-09-30 DIAGNOSIS — M545 Low back pain: Secondary | ICD-10-CM

## 2014-10-08 ENCOUNTER — Ambulatory Visit (HOSPITAL_COMMUNITY)
Admission: RE | Admit: 2014-10-08 | Discharge: 2014-10-08 | Disposition: A | Discharge status: Home / Self Care | Payer: Medicare Other | Source: Ambulatory Visit | Attending: Family Medicine | Admitting: Family Medicine

## 2014-10-08 DIAGNOSIS — R200 Anesthesia of skin: Secondary | ICD-10-CM | POA: Diagnosis not present

## 2014-10-08 DIAGNOSIS — M4806 Spinal stenosis, lumbar region: Secondary | ICD-10-CM | POA: Insufficient documentation

## 2014-10-08 DIAGNOSIS — M5126 Other intervertebral disc displacement, lumbar region: Secondary | ICD-10-CM | POA: Insufficient documentation

## 2014-10-08 DIAGNOSIS — M1288 Other specific arthropathies, not elsewhere classified, other specified site: Secondary | ICD-10-CM | POA: Insufficient documentation

## 2014-10-08 DIAGNOSIS — M545 Low back pain: Secondary | ICD-10-CM | POA: Insufficient documentation

## 2014-10-08 DIAGNOSIS — M5136 Other intervertebral disc degeneration, lumbar region: Secondary | ICD-10-CM | POA: Insufficient documentation

## 2014-10-15 ENCOUNTER — Other Ambulatory Visit: Payer: Self-pay | Admitting: Neurosurgery

## 2014-10-15 DIAGNOSIS — M5126 Other intervertebral disc displacement, lumbar region: Secondary | ICD-10-CM

## 2014-10-16 ENCOUNTER — Ambulatory Visit
Admission: RE | Admit: 2014-10-16 | Discharge: 2014-10-16 | Disposition: A | Discharge status: Home / Self Care | Payer: Medicare Other | Source: Ambulatory Visit | Attending: Neurosurgery | Admitting: Neurosurgery

## 2014-10-16 ENCOUNTER — Other Ambulatory Visit: Payer: Self-pay | Admitting: Neurosurgery

## 2014-10-16 VITALS — BP 143/83 | HR 80

## 2014-10-16 DIAGNOSIS — M5126 Other intervertebral disc displacement, lumbar region: Secondary | ICD-10-CM

## 2014-10-16 DIAGNOSIS — M5137 Other intervertebral disc degeneration, lumbosacral region: Secondary | ICD-10-CM

## 2014-10-16 MED ORDER — METHYLPREDNISOLONE ACETATE 40 MG/ML INJ SUSP (RADIOLOG
120.0000 mg | Freq: Once | INTRAMUSCULAR | Status: AC
  Administered 2014-10-16: 120 mg via EPIDURAL

## 2014-10-16 MED ORDER — IOHEXOL 180 MG/ML  SOLN
1.0000 mL | Freq: Once | INTRAMUSCULAR | Status: DC | PRN
  Administered 2014-10-16: 1 mL via EPIDURAL

## 2014-10-16 NOTE — Discharge Instructions (Signed)

## 2014-11-13 ENCOUNTER — Other Ambulatory Visit: Payer: Self-pay | Admitting: Neurosurgery

## 2014-11-13 DIAGNOSIS — M549 Dorsalgia, unspecified: Secondary | ICD-10-CM

## 2014-11-20 ENCOUNTER — Other Ambulatory Visit: Payer: Self-pay | Admitting: Neurosurgery

## 2014-11-20 ENCOUNTER — Ambulatory Visit
Admission: RE | Admit: 2014-11-20 | Discharge: 2014-11-20 | Disposition: A | Discharge status: Home / Self Care | Payer: Medicare Other | Source: Ambulatory Visit | Attending: Neurosurgery | Admitting: Neurosurgery

## 2014-11-20 DIAGNOSIS — M549 Dorsalgia, unspecified: Secondary | ICD-10-CM

## 2014-11-20 DIAGNOSIS — M5137 Other intervertebral disc degeneration, lumbosacral region: Secondary | ICD-10-CM

## 2014-11-20 MED ORDER — METHYLPREDNISOLONE ACETATE 40 MG/ML INJ SUSP (RADIOLOG
120.0000 mg | Freq: Once | INTRAMUSCULAR | Status: AC
  Administered 2014-11-20: 120 mg via EPIDURAL

## 2014-11-20 MED ORDER — IOHEXOL 180 MG/ML  SOLN
1.0000 mL | Freq: Once | INTRAMUSCULAR | Status: DC | PRN
  Administered 2014-11-20: 1 mL via EPIDURAL

## 2014-11-20 NOTE — Discharge Instructions (Signed)

## 2015-01-08 ENCOUNTER — Other Ambulatory Visit: Payer: Self-pay | Admitting: Neurosurgery

## 2015-01-08 DIAGNOSIS — M549 Dorsalgia, unspecified: Secondary | ICD-10-CM

## 2015-01-09 ENCOUNTER — Ambulatory Visit
Admission: RE | Admit: 2015-01-09 | Discharge: 2015-01-09 | Disposition: A | Discharge status: Home / Self Care | Payer: Medicare Other | Source: Ambulatory Visit | Attending: Neurosurgery | Admitting: Neurosurgery

## 2015-01-09 DIAGNOSIS — M549 Dorsalgia, unspecified: Secondary | ICD-10-CM

## 2015-01-09 MED ORDER — METHYLPREDNISOLONE ACETATE 40 MG/ML INJ SUSP (RADIOLOG: 120.0000 mg | Freq: Once | INTRAMUSCULAR | Status: DC

## 2015-01-09 MED ORDER — IOHEXOL 180 MG/ML  SOLN: 1.0000 mL | Freq: Once | INTRAMUSCULAR | Status: DC | PRN

## 2015-03-08 HISTORY — PX: OTHER SURGICAL HISTORY: SHX169

## 2016-01-05 ENCOUNTER — Ambulatory Visit (HOSPITAL_COMMUNITY)
Admission: RE | Admit: 2016-01-05 | Discharge: 2016-01-05 | Disposition: A | Discharge status: Home / Self Care | Payer: Medicare Other | Source: Ambulatory Visit | Attending: Family Medicine | Admitting: Family Medicine

## 2016-01-05 ENCOUNTER — Other Ambulatory Visit (HOSPITAL_COMMUNITY): Payer: Self-pay | Admitting: Family Medicine

## 2016-01-05 DIAGNOSIS — M7502 Adhesive capsulitis of left shoulder: Secondary | ICD-10-CM

## 2016-01-05 DIAGNOSIS — M25512 Pain in left shoulder: Secondary | ICD-10-CM | POA: Diagnosis present

## 2016-01-05 DIAGNOSIS — I700 Atherosclerosis of aorta: Secondary | ICD-10-CM | POA: Insufficient documentation

## 2016-01-05 DIAGNOSIS — M19012 Primary osteoarthritis, left shoulder: Secondary | ICD-10-CM | POA: Insufficient documentation

## 2016-03-25 ENCOUNTER — Encounter: Payer: Self-pay | Admitting: Orthopedic Surgery

## 2016-03-25 ENCOUNTER — Ambulatory Visit (INDEPENDENT_AMBULATORY_CARE_PROVIDER_SITE_OTHER): Payer: Medicare Other | Admitting: Orthopedic Surgery

## 2016-03-25 VITALS — BP 156/90 | HR 83 | Wt 178.0 lb

## 2016-03-25 DIAGNOSIS — M7552 Bursitis of left shoulder: Secondary | ICD-10-CM

## 2016-03-25 DIAGNOSIS — M47812 Spondylosis without myelopathy or radiculopathy, cervical region: Secondary | ICD-10-CM | POA: Diagnosis not present

## 2016-03-25 NOTE — Progress Notes (Signed)
Patient ID: Jason Singleton, male   DOB: [birth year], 76 y.o.   MRN: [id number]  Chief Complaint  Patient presents with  . Shoulder Pain    LEFT SHOULDER PAIN    HPI Jason Singleton is a 76 y.o. male.  14 male with history of degenerative disc disease followed by chiropractor presents with shoulder discomfort on the left side associated with nocturnal symptoms painful [place] elevation and bilateral shoulder pain some next stiffness and neck pain but no gait disturbance or numbness and tingling in his upper extremities  Pain comes and goes intermittent in intensity seems to be aggravated by position  Review of Systems Review of Systems  Constitutional: Negative for fever.  Musculoskeletal: Negative for gait problem.  Neurological: Negative for weakness and numbness.    Past Medical History:  Diagnosis Date  . Hypothyroidism   . Interstitial cystitis   . Pelvic floor dysfunction   . Primary snoring 06/06/2013   Patient's relatives witnessed thunderous snoring, and pauses in breathing during sleep. 04-2013     Past Surgical History:  Procedure Laterality Date  . COLONOSCOPY  2011   SLF: 1. Rare ascending colon diverticula. Rare descending colon diverticula. Frequent sigmoid colon diverticula. The diverticula wer associated. with hypertrophy folds. Otherwise no polyps, masses, AVMs or inflammatory changes. 2. Small internal hemorrhoids. Otherwise, normal retroflexed view of the rectum.   . COLONOSCOPY N/A 07/22/2014   SIMPLE ADENOMA(2), MOD IH, L TICS  . MOUTH SURGERY  2010  . VASECTOMY  1970    Family History  Problem Relation Age of Onset  . Pancreatic cancer Mother   . Stroke Father   . Celiac disease Sister   . Celiac disease Sister   . Celiac disease Brother   . Heart attack Mother   . Heart attack Father     Social History Social History  Substance Use Topics  . Smoking status: Former Research scientist (life sciences)  . Smokeless tobacco: Never Used     Comment: Quit in 1967  . Alcohol use Yes      Comment: 3 glasses of wine weekly    Allergies  Allergen Reactions  . Penicillins Swelling    "major swelling at injection site as a teenager"  . Sulfa Antibiotics Rash    Current Outpatient Prescriptions  Medication Sig Dispense Refill  . baclofen (LIORESAL) 20 MG tablet 1 tablet 2 (two) times daily.    Marland Kitchen levothyroxine (SYNTHROID, LEVOTHROID) 75 MCG tablet 1 tablet daily.    . OnabotulinumtoxinA (BOTOX IJ) Inject as directed. Treatment for bladder sphincter as needed    . tamsulosin (FLOMAX) 0.4 MG CAPS capsule 1 capsule as needed.      No current facility-administered medications for this visit.     Physical Exam BP (!) 156/90   Pulse 83   Wt 178 lb (80.7 kg)   BMI 25.54 kg/m  Physical Exam  Constitutional: He is oriented to person, place, and time. He appears well-developed and well-nourished. No distress.  Cardiovascular: Normal rate and intact distal pulses.   Neurological: He is alert and oriented to person, place, and time.  Skin: Skin is warm and dry. No rash noted. He is not diaphoretic. No erythema. No pallor.  Psychiatric: He has a normal mood and affect. His behavior is normal. Judgment and thought content normal.   Ambulatory status normal with no assistive devices Ortho Exam   Data Reviewed Imaging of the left shoulder are independently reviewed and I interpreted these as mild degenerative changes of the acromioclavicular  joint  MRI reports And plain films of the cervical spine  CERVICAL SPINE 5 VIEWS:   Anatomic alignment without fracture or subluxation. Multilevel degenerative disc disease with disc space narrowing and endplate spur formation, greatest C5-C6. Prevertebral soft tissues normal thickness. Multilevel encroachment upon bilateral cervical neural foramina by uncovertebral spurring. C1-C2 alignment normal for degree of head rotation. Facet degenerative changes diffusely cervical spine. Lung apices clear.   IMPRESSION: Extensive  degenerative disc and facet disease changes cervical spine. Multilevel cervical neural foraminal encroachment by uncovertebral spurs. No acute abnormality.   Provider: Alisia Ferrari MRI CERVICAL SPINE WITHOUT CONTRAST:  Routine MR imaging of the cervical spine is performed without contrast.  Findings:  Vertebral body height, signal and alignment are maintained. Craniocervical junction appears normal. Size, shape and signal of the imaged spinal cord are normal. The C2-3 level is unremarkable.  At C3-4, there is posterior bony ridging with a disk osteophyte complex narrowing the ventral thecal sac. Uncovertebral disease causes mild bilateral neural foraminal narrowing.  At C4-5, again seen is posterior bony ridging with a disk osteophyte complex narrowing the ventral thecal sac. Neural foramina appear patent.  At C5-6, there is more prominent posterior disk osteophyte complex narrowing the ventral thecal sac. The spinal cord is mildly flattened. Neural foramina are moderately narrowed bilaterally.  At C6-7, there is some posterior bony ridging narrowing the ventral thecal sac. Neural foramina are mildly narrow.   C7-T1 level is unremarkable.   IMPRESSION:  Spondylosis from C3-4 to C6-7 with a C5-6 level most notably effected.   Assessment  Cervical spondylosis with no gait disturbances or neurologic symptoms are present  Left shoulder bursitis Encounter Diagnoses  Name Primary?  . Bursitis of left shoulder Yes  . Cervical spondylosis without myelopathy      Plan  HEP Inject left shoulder  Procedure note the subacromial injection shoulder left   Verbal consent was obtained to inject the  Left   Shoulder  Timeout was completed to confirm the injection site is a subacromial space of the  left  shoulder  Medication used Depo-Medrol 40 mg and lidocaine 1% 3 cc  Anesthesia was provided by ethyl chloride  The injection was performed in the left  posterior  subacromial space. After pinning the skin with alcohol and anesthetized the skin with ethyl chloride the subacromial space was injected using a 20-gauge needle. There were no complications  Sterile dressing was applied.   FU AS NEEDED

## 2016-03-25 NOTE — Patient Instructions (Signed)
DO THE EXERCISES FOR 6 WEEKS  IF THERE IS NO IMPROVEMENT PLEASE FEEL FREE TO SCHEDULE ANOTHER APPOINTMENT   Encounter Diagnoses  Name Primary?  . Bursitis of left shoulder Yes  . Cervical spondylosis without myelopathy

## 2018-07-25 ENCOUNTER — Ambulatory Visit (HOSPITAL_COMMUNITY)
Admission: RE | Admit: 2018-07-25 | Discharge: 2018-07-25 | Disposition: A | Discharge status: Home / Self Care | Payer: Medicare Other | Source: Ambulatory Visit | Attending: Family Medicine | Admitting: Family Medicine

## 2018-07-25 ENCOUNTER — Other Ambulatory Visit (HOSPITAL_COMMUNITY): Payer: Self-pay | Admitting: Family Medicine

## 2018-07-25 ENCOUNTER — Other Ambulatory Visit: Payer: Self-pay

## 2018-07-25 DIAGNOSIS — M25551 Pain in right hip: Secondary | ICD-10-CM | POA: Diagnosis present

## 2018-07-25 DIAGNOSIS — G8929 Other chronic pain: Secondary | ICD-10-CM | POA: Diagnosis present

## 2018-07-25 DIAGNOSIS — Z96642 Presence of left artificial hip joint: Secondary | ICD-10-CM | POA: Diagnosis present

## 2018-07-25 DIAGNOSIS — M25552 Pain in left hip: Secondary | ICD-10-CM

## 2018-08-27 ENCOUNTER — Other Ambulatory Visit: Payer: Self-pay

## 2018-08-27 ENCOUNTER — Other Ambulatory Visit: Payer: Medicare Other

## 2018-08-27 DIAGNOSIS — Z20822 Contact with and (suspected) exposure to covid-19: Secondary | ICD-10-CM

## 2018-08-27 NOTE — Progress Notes (Signed)
[id number] 

## 2018-08-31 ENCOUNTER — Other Ambulatory Visit (HOSPITAL_COMMUNITY): Payer: Self-pay | Admitting: Family Medicine

## 2018-08-31 DIAGNOSIS — G8929 Other chronic pain: Secondary | ICD-10-CM

## 2018-08-31 DIAGNOSIS — M25551 Pain in right hip: Secondary | ICD-10-CM

## 2018-08-31 LAB — NOVEL CORONAVIRUS, NAA: SARS-CoV-2, NAA: NOT DETECTED

## 2018-09-03 ENCOUNTER — Telehealth: Payer: Self-pay

## 2018-09-03 NOTE — Telephone Encounter (Signed)
Patient called and says he and his wife were around the same people who tested positive for covid. They both were tested on 08/30/18 and he says she tested positive and he tested negative. He asks does he need to be retested. I asked how was the test done, he says it was done with the swab all the way up his nose into his sinuses. I advised him to contact his PCP to get the advice of him, since the test was done on 08/30/18 and done adequately. He verbalized understanding.

## 2018-09-13 ENCOUNTER — Other Ambulatory Visit: Payer: Medicare Other

## 2018-09-13 ENCOUNTER — Other Ambulatory Visit: Payer: Self-pay

## 2018-09-13 DIAGNOSIS — Z20822 Contact with and (suspected) exposure to covid-19: Secondary | ICD-10-CM

## 2018-09-20 LAB — NOVEL CORONAVIRUS, NAA: SARS-CoV-2, NAA: NOT DETECTED

## 2019-03-18 ENCOUNTER — Other Ambulatory Visit: Payer: Self-pay

## 2019-03-18 ENCOUNTER — Ambulatory Visit: Payer: Medicare PPO | Attending: Internal Medicine

## 2019-03-18 DIAGNOSIS — Z20822 Contact with and (suspected) exposure to covid-19: Secondary | ICD-10-CM

## 2019-03-20 LAB — NOVEL CORONAVIRUS, NAA: SARS-CoV-2, NAA: NOT DETECTED

## 2019-05-02 ENCOUNTER — Ambulatory Visit: Payer: Medicare PPO | Attending: Internal Medicine

## 2019-05-02 DIAGNOSIS — Z23 Encounter for immunization: Secondary | ICD-10-CM

## 2019-05-02 NOTE — Progress Notes (Signed)
   Covid-19 Vaccination Clinic  Name:  Jason Singleton    MRN: [id number] DOB: 09-Jul-1940  05/02/2019  Jason Singleton was observed post Covid-19 immunization for 15 minutes without incidence. He was provided with Vaccine Information Sheet and instruction to access the V-Safe system.   Jason Singleton was instructed to call 911 with any severe reactions post vaccine: Marland Kitchen Difficulty breathing  . Swelling of your face and throat  . A fast heartbeat  . A bad rash all over your body  . Dizziness and weakness    Immunizations Administered    Name Date Dose VIS Date Route   Pfizer COVID-19 Vaccine 05/02/2019  9:18 AM 0.3 mL 02/15/2019 Intramuscular   Manufacturer: [place]   Lot: [id number]   [employer]: [id number]

## 2019-05-28 ENCOUNTER — Ambulatory Visit: Payer: Medicare PPO | Attending: Internal Medicine

## 2019-05-28 DIAGNOSIS — Z23 Encounter for immunization: Secondary | ICD-10-CM

## 2019-05-28 NOTE — Progress Notes (Signed)
   Covid-19 Vaccination Clinic  Name:  SUMMIE HOEFERT    MRN: [id number] DOB: 02/29/1941  05/28/2019  Mr. Durling was observed post Covid-19 immunization for 15 minutes without incident. He was provided with Vaccine Information Sheet and instruction to access the V-Safe system.   Mr. Eavey was instructed to call 911 with any severe reactions post vaccine: Marland Kitchen Difficulty breathing  . Swelling of face and throat  . A fast heartbeat  . A bad rash all over body  . Dizziness and weakness   Immunizations Administered    Name Date Dose VIS Date Route   Pfizer COVID-19 Vaccine 05/28/2019  8:32 AM 0.3 mL 02/15/2019 Intramuscular   Manufacturer: [place]   Lot: [place]   [place]: [id number]

## 2019-06-19 ENCOUNTER — Encounter: Payer: Self-pay | Admitting: Gastroenterology

## 2019-06-24 ENCOUNTER — Other Ambulatory Visit (HOSPITAL_COMMUNITY): Payer: Self-pay | Admitting: Family Medicine

## 2019-06-24 ENCOUNTER — Encounter: Payer: Self-pay | Admitting: Gastroenterology

## 2019-06-24 ENCOUNTER — Other Ambulatory Visit: Payer: Self-pay

## 2019-06-24 ENCOUNTER — Ambulatory Visit (HOSPITAL_COMMUNITY)
Admission: RE | Admit: 2019-06-24 | Discharge: 2019-06-24 | Disposition: A | Discharge status: Home / Self Care | Payer: Medicare PPO | Source: Ambulatory Visit | Attending: Family Medicine | Admitting: Family Medicine

## 2019-06-24 DIAGNOSIS — M19071 Primary osteoarthritis, right ankle and foot: Secondary | ICD-10-CM | POA: Insufficient documentation

## 2019-07-23 ENCOUNTER — Encounter: Payer: Self-pay | Admitting: Gastroenterology

## 2019-10-03 NOTE — Progress Notes (Signed)
Primary Care Physician:  Lucia Gaskins, MD  Primary Gastroenterologist: Formerly Dr. Oneida Alar  Chief Complaint  Patient presents with  . Colonoscopy    consult    HPI:  Jason Singleton is a 79 y.o. male here at the request of Dr. Cindie Laroche for consideration of colonoscopy.  Given his age he was brought in for discussion regarding colonoscopy.  His last colonoscopy was in 2016.  He had 2 simple adenomas removed from his colon.  Advised to consider colonoscopy in 5 to 10 years if benefits outweigh the risk.  He had a sister who died with colon cancer at age greater than 43.  BM regular. No melena, brbpr. No abdominal pain. Limited heartburn related to certain foods, TUMS helps. Diverticulitis 5 months ago. He has had intentional weight loss of 6 pounds. He is very active and trying to take care of his health.   Previously tested negative for sleep apnea. He has Interstim generator placed for pelvic floor dysfunction and urinary retention.    Current Outpatient Medications  Medication Sig Dispense Refill  . levothyroxine (SYNTHROID, LEVOTHROID) 75 MCG tablet 1 tablet daily.     No current facility-administered medications for this visit.    Allergies as of 10/04/2019 - Review Complete 10/04/2019  Allergen Reaction Noted  . Penicillins Swelling 06/06/2013  . Sulfa antibiotics Rash 06/06/2013    Past Medical History:  Diagnosis Date  . Hypothyroidism   . Interstitial cystitis   . Pelvic floor dysfunction   . Primary snoring 06/06/2013   Patient's relatives witnessed thunderous snoring, and pauses in breathing during sleep. 04-2013     Past Surgical History:  Procedure Laterality Date  . COLONOSCOPY  2011   SLF: 1. Rare ascending colon diverticula. Rare descending colon diverticula. Frequent sigmoid colon diverticula. The diverticula wer associated. with hypertrophy folds. Otherwise no polyps, masses, AVMs or inflammatory changes. 2. Small internal hemorrhoids. Otherwise, normal  retroflexed view of the rectum.   . COLONOSCOPY N/A 07/22/2014   SIMPLE ADENOMA(2), MOD IH, L TICS  . Interstim generator placement  2017  . MOUTH SURGERY  2010  . VASECTOMY  1970    Family History  Problem Relation Age of Onset  . Pancreatic cancer Mother   . Heart attack Mother   . Stroke Father   . Heart attack Father   . Celiac disease Sister   . Celiac disease Sister   . Celiac disease Brother   . Colon cancer Sister        age greater than 70    Social History   Socioeconomic History  . Marital status: Married    Spouse name: Jason Singleton  . Number of children: 4  . Years of education: Masters +  . Highest education level: Not on file  Occupational History  . Not on file  Tobacco Use  . Smoking status: Former Research scientist (life sciences)  . Smokeless tobacco: Never Used  . Tobacco comment: Quit in 1967  Substance and Sexual Activity  . Alcohol use: Yes    Comment: 3 glasses of wine weekly  . Drug use: No    Comment: Marijuana in the past  . Sexual activity: Not on file  Other Topics Concern  . Not on file  Social History Narrative   Patient is married Jason Singleton) and lives at home with his wife, daughter, son-in-law and grandson.   Patient is retired.   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 (Three children  are adopted)   Patient drinks 3/4 cups of coffee daily.         Social Determinants of Health   Financial Resource Strain:   . Difficulty of Paying Living Expenses:   Food Insecurity:   . Worried About Charity fundraiser in the Last Year:   . Arboriculturist in the Last Year:   Transportation Needs:   . Film/video editor (Medical):   Marland Kitchen Lack of Transportation (Non-Medical):   Physical Activity:   . Days of Exercise per Week:   . Minutes of Exercise per Session:   Stress:   . Feeling of Stress :   Social Connections:   . Frequency of Communication with Friends and Family:   . Frequency of Social Gatherings with  Friends and Family:   . Attends Religious Services:   . Active Member of Clubs or Organizations:   . Attends Archivist Meetings:   Marland Kitchen Marital Status:   Intimate Partner Violence:   . Fear of Current or Ex-Partner:   . Emotionally Abused:   Marland Kitchen Physically Abused:   . Sexually Abused:       ROS:  General: Negative for anorexia, weight loss, fever, chills, fatigue, weakness. Eyes: Negative for vision changes.  ENT: Negative for hoarseness, difficulty swallowing , nasal congestion. CV: Negative for chest pain, angina, palpitations, dyspnea on exertion, peripheral edema.  Respiratory: Negative for dyspnea at rest, dyspnea on exertion, cough, sputum, wheezing.  GI: See history of present illness. GU:  Negative for dysuria, hematuria, urinary incontinence, urinary frequency, nocturnal urination.  MS: Negative for joint pain, low back pain.  Derm: Negative for rash or itching.  Neuro: Negative for weakness, abnormal sensation, seizure, frequent headaches, memory loss, confusion.  Psych: Negative for anxiety, depression, suicidal ideation, hallucinations.  Endo: Negative for unusual weight change.  Heme: Negative for bruising or bleeding. Allergy: Negative for rash or hives.    Physical Examination:  BP (!) 143/87   Pulse 79   Temp (!) 97.1 F (36.2 C) (Temporal)   Ht 5\' 10"  (1.778 m)   Wt 177 lb 3.2 oz (80.4 kg)   BMI 25.43 kg/m    General: Well-nourished, well-developed in no acute distress.  Head: Normocephalic, atraumatic.   Eyes: Conjunctiva pink, no icterus. Mouth: masked. Neck: Supple without thyromegaly, masses, or lymphadenopathy.  Lungs: Clear to auscultation bilaterally.  Heart: Regular rate and rhythm, no murmurs rubs or gallops.  Abdomen: Bowel sounds are normal, nontender, nondistended, no hepatosplenomegaly or masses, no abdominal bruits or    hernia , no rebound or guarding.   Rectal: not performed Extremities: No lower extremity edema. No clubbing or  deformities.  Neuro: Alert and oriented x 4 , grossly normal neurologically.  Skin: Warm and dry, no rash or jaundice.   Psych: Alert and cooperative, normal mood and affect.  Labs: Labs from April 2021: BUN 22, creatinine 1.09, albumin 4.3, total bilirubin 0.6, alkaline phosphatase 54, AST 31, ALT 22, TSH 3.86, white blood cell count 5500, hemoglobin 13.9, platelets 220,000  Imaging Studies: No results found.  Impression/Plan:  80 y/o male with history of hypothyroidism, pelvic floor dysfunction (Interstim Generator placed 2017), FH CRC sister, personal history of adenomatous colon polyps presenting for five year surveillance colonoscopy. We discussed option of forgoing future surveillance colonoscopies due to age and current guidelines. He enjoys very good health for his age and has h/o longevity in his family with multiple members living and functioning highly into their 61s. He  desires surveillance colonoscopy at this time. He is ASA II. Colonoscopy with Dr. Abbey Chatters in near future, deep sedation in OR (due to age).  I have discussed the risks, alternatives, benefits with regards to but not limited to the risk of reaction to medication, bleeding, infection, perforation and the patient is agreeable to proceed. Written consent to be obtained.

## 2019-10-04 ENCOUNTER — Encounter: Payer: Self-pay | Admitting: Gastroenterology

## 2019-10-04 ENCOUNTER — Ambulatory Visit: Payer: Medicare PPO | Admitting: Gastroenterology

## 2019-10-04 ENCOUNTER — Other Ambulatory Visit: Payer: Self-pay

## 2019-10-04 VITALS — BP 143/87 | HR 79 | Temp 97.1°F | Ht 70.0 in | Wt 177.2 lb

## 2019-10-04 DIAGNOSIS — Z800 Family history of malignant neoplasm of digestive organs: Secondary | ICD-10-CM | POA: Diagnosis not present

## 2019-10-04 DIAGNOSIS — Z86010 Personal history of colonic polyps: Secondary | ICD-10-CM | POA: Diagnosis not present

## 2019-10-04 NOTE — Patient Instructions (Signed)
Colonoscopy to be scheduled. We will contact you first of the week with further details.

## 2019-10-14 ENCOUNTER — Telehealth: Payer: Self-pay | Admitting: *Deleted

## 2019-10-14 NOTE — Telephone Encounter (Signed)
Called pt. He is scheduled for TCS with Dr. Abbey Chatters 9/21 at 9:00am. Patient aware will need pre-op/covid test prior. Advised will mail this appt with his prep instructions. Confirmed mailing address.

## 2019-10-15 ENCOUNTER — Encounter: Payer: Self-pay | Admitting: *Deleted

## 2019-10-15 ENCOUNTER — Telehealth: Payer: Self-pay | Admitting: *Deleted

## 2019-10-15 NOTE — Telephone Encounter (Signed)
PA pending clinical review via [facility] website for TCS. Tracking # [id number]

## 2019-10-16 NOTE — Telephone Encounter (Signed)
PA approved. Auth# [id number] dates 11/26/19-12/26/19

## 2019-10-25 ENCOUNTER — Telehealth: Payer: Self-pay | Admitting: Internal Medicine

## 2019-10-25 NOTE — Telephone Encounter (Signed)
[phone] patient called to reschedule procedure

## 2019-10-28 NOTE — Telephone Encounter (Signed)
LMOVM for pt 

## 2019-10-30 ENCOUNTER — Telehealth: Payer: Self-pay | Admitting: Internal Medicine

## 2019-10-30 NOTE — Telephone Encounter (Signed)
Tried to call pt, no answer, LMOVM for return call.  

## 2019-10-30 NOTE — Telephone Encounter (Signed)
PATIENT RECEIVED VOICE MAIL,RETURNED CALL

## 2019-11-07 NOTE — Telephone Encounter (Signed)
Letter mailed

## 2019-11-22 ENCOUNTER — Encounter (HOSPITAL_COMMUNITY)
Admission: RE | Admit: 2019-11-22 | Discharge: 2019-11-22 | Disposition: A | Discharge status: Home / Self Care | Payer: Medicare PPO | Source: Ambulatory Visit | Attending: Internal Medicine | Admitting: Internal Medicine

## 2019-11-22 ENCOUNTER — Other Ambulatory Visit (HOSPITAL_COMMUNITY): Payer: Medicare PPO

## 2019-11-22 ENCOUNTER — Other Ambulatory Visit: Payer: Self-pay

## 2019-11-25 ENCOUNTER — Other Ambulatory Visit (HOSPITAL_COMMUNITY)
Admission: RE | Admit: 2019-11-25 | Discharge: 2019-11-25 | Disposition: A | Discharge status: Home / Self Care | Payer: Medicare PPO | Source: Ambulatory Visit | Attending: Internal Medicine | Admitting: Internal Medicine

## 2019-11-25 ENCOUNTER — Other Ambulatory Visit: Payer: Self-pay

## 2019-11-25 DIAGNOSIS — Z01812 Encounter for preprocedural laboratory examination: Secondary | ICD-10-CM | POA: Diagnosis present

## 2019-11-25 DIAGNOSIS — Z20822 Contact with and (suspected) exposure to covid-19: Secondary | ICD-10-CM | POA: Insufficient documentation

## 2019-11-25 LAB — SARS CORONAVIRUS 2 (TAT 6-24 HRS): SARS Coronavirus 2: NEGATIVE

## 2019-11-26 ENCOUNTER — Encounter (HOSPITAL_COMMUNITY): Payer: Self-pay

## 2019-11-26 ENCOUNTER — Ambulatory Visit (HOSPITAL_COMMUNITY): Payer: Medicare PPO | Admitting: Anesthesiology

## 2019-11-26 ENCOUNTER — Encounter (HOSPITAL_COMMUNITY)
Admission: RE | Disposition: A | Discharge status: Home / Self Care | Payer: Self-pay | Source: Ambulatory Visit | Attending: Internal Medicine

## 2019-11-26 ENCOUNTER — Other Ambulatory Visit: Payer: Self-pay

## 2019-11-26 ENCOUNTER — Ambulatory Visit (HOSPITAL_COMMUNITY)
Admission: RE | Admit: 2019-11-26 | Discharge: 2019-11-26 | Disposition: A | Discharge status: Home / Self Care | Payer: Medicare PPO | Source: Ambulatory Visit | Attending: Internal Medicine | Admitting: Internal Medicine

## 2019-11-26 DIAGNOSIS — Z7982 Long term (current) use of aspirin: Secondary | ICD-10-CM | POA: Insufficient documentation

## 2019-11-26 DIAGNOSIS — Z880 Allergy status to penicillin: Secondary | ICD-10-CM | POA: Insufficient documentation

## 2019-11-26 DIAGNOSIS — Z882 Allergy status to sulfonamides status: Secondary | ICD-10-CM | POA: Diagnosis not present

## 2019-11-26 DIAGNOSIS — R0683 Snoring: Secondary | ICD-10-CM | POA: Diagnosis not present

## 2019-11-26 DIAGNOSIS — Z8249 Family history of ischemic heart disease and other diseases of the circulatory system: Secondary | ICD-10-CM | POA: Diagnosis not present

## 2019-11-26 DIAGNOSIS — Z8379 Family history of other diseases of the digestive system: Secondary | ICD-10-CM | POA: Insufficient documentation

## 2019-11-26 DIAGNOSIS — Z87891 Personal history of nicotine dependence: Secondary | ICD-10-CM | POA: Insufficient documentation

## 2019-11-26 DIAGNOSIS — K5730 Diverticulosis of large intestine without perforation or abscess without bleeding: Secondary | ICD-10-CM | POA: Diagnosis not present

## 2019-11-26 DIAGNOSIS — D120 Benign neoplasm of cecum: Secondary | ICD-10-CM | POA: Diagnosis not present

## 2019-11-26 DIAGNOSIS — Z800 Family history of malignant neoplasm of digestive organs: Secondary | ICD-10-CM | POA: Diagnosis not present

## 2019-11-26 DIAGNOSIS — Z79899 Other long term (current) drug therapy: Secondary | ICD-10-CM | POA: Diagnosis not present

## 2019-11-26 DIAGNOSIS — Z823 Family history of stroke: Secondary | ICD-10-CM | POA: Insufficient documentation

## 2019-11-26 DIAGNOSIS — Z86010 Personal history of colonic polyps: Secondary | ICD-10-CM | POA: Insufficient documentation

## 2019-11-26 DIAGNOSIS — D122 Benign neoplasm of ascending colon: Secondary | ICD-10-CM | POA: Insufficient documentation

## 2019-11-26 DIAGNOSIS — K648 Other hemorrhoids: Secondary | ICD-10-CM | POA: Diagnosis not present

## 2019-11-26 DIAGNOSIS — Z1211 Encounter for screening for malignant neoplasm of colon: Secondary | ICD-10-CM | POA: Insufficient documentation

## 2019-11-26 DIAGNOSIS — K635 Polyp of colon: Secondary | ICD-10-CM | POA: Diagnosis not present

## 2019-11-26 DIAGNOSIS — E039 Hypothyroidism, unspecified: Secondary | ICD-10-CM | POA: Insufficient documentation

## 2019-11-26 HISTORY — PX: POLYPECTOMY: SHX5525

## 2019-11-26 HISTORY — PX: COLONOSCOPY WITH PROPOFOL: SHX5780

## 2019-11-26 SURGERY — COLONOSCOPY WITH PROPOFOL
Anesthesia: General

## 2019-11-26 MED ORDER — LACTATED RINGERS IV SOLN: INTRAVENOUS | Status: DC | PRN

## 2019-11-26 MED ORDER — CHLORHEXIDINE GLUCONATE CLOTH 2 % EX PADS: 6.0000 | MEDICATED_PAD | Freq: Once | CUTANEOUS | Status: DC

## 2019-11-26 MED ORDER — LACTATED RINGERS IV SOLN: Freq: Once | INTRAVENOUS | Status: AC

## 2019-11-26 MED ORDER — PROPOFOL 10 MG/ML IV BOLUS
INTRAVENOUS | Status: DC | PRN
  Administered 2019-11-26 (×2): 40 mg via INTRAVENOUS

## 2019-11-26 MED ORDER — STERILE WATER FOR IRRIGATION IR SOLN
Status: DC | PRN
  Administered 2019-11-26: 100 mL

## 2019-11-26 MED ORDER — PROPOFOL 500 MG/50ML IV EMUL
INTRAVENOUS | Status: DC | PRN
  Administered 2019-11-26: 100 ug/kg/min via INTRAVENOUS

## 2019-11-26 NOTE — Progress Notes (Signed)
Dr.Carver at bedside reviewing colonoscopy findings.

## 2019-11-26 NOTE — Anesthesia Preprocedure Evaluation (Signed)
Anesthesia Evaluation  Patient identified by MRN, date of birth, ID band Patient awake    Reviewed: Allergy & Precautions, NPO status , Patient's Chart, lab work & pertinent test results  History of Anesthesia Complications Negative for: history of anesthetic complications  Airway Mallampati: III  TM Distance: >3 FB Neck ROM: Full    Dental  (+) Dental Advisory Given Crowns:   Pulmonary neg sleep apnea (SNORING), former smoker,    Pulmonary exam normal breath sounds clear to auscultation       Cardiovascular Exercise Tolerance: Good Normal cardiovascular exam Rhythm:Regular Rate:Normal     Neuro/Psych  Neuromuscular disease negative psych ROS   GI/Hepatic negative GI ROS, Neg liver ROS,   Endo/Other  Hypothyroidism   Renal/GU Renal InsufficiencyRenal disease     Musculoskeletal negative musculoskeletal ROS (+)   Abdominal   Peds  Hematology negative hematology ROS (+)   Anesthesia Other Findings   Reproductive/Obstetrics                            Anesthesia Physical Anesthesia Plan  ASA: II  Anesthesia Plan: General   Post-op Pain Management:    Induction: Intravenous  PONV Risk Score and Plan: TIVA  Airway Management Planned: Nasal Cannula and Natural Airway  Additional Equipment:   Intra-op Plan:   Post-operative Plan:   Informed Consent: I have reviewed the patients History and Physical, chart, labs and discussed the procedure including the risks, benefits and alternatives for the proposed anesthesia with the patient or authorized representative who has indicated his/her understanding and acceptance.     Dental advisory given  Plan Discussed with: CRNA and Surgeon  Anesthesia Plan Comments:         Anesthesia Quick Evaluation

## 2019-11-26 NOTE — Op Note (Signed)
[facility] Patient Name: Jason Singleton Procedure Date: 11/26/2019 9:04 AM MRN: [id number] Date of Birth: 1940-09-25 Attending MD: Elon Alas. Edgar Frisk CSN: [id number] Age: 79 Admit Type: Outpatient Procedure:                Colonoscopy Indications:              High risk colon cancer surveillance: Personal                            history of colonic polyps Providers:                Elon Alas. Abbey Chatters, DO, Caprice Kluver, Crystal Page,                            Wynonia Musty Tech, Technician, Aram Candela Referring MD:              Medicines:                See the Anesthesia note for documentation of the                            administered medications Complications:             Estimated Blood Loss:     Estimated blood loss was minimal. Procedure:                Pre-Anesthesia Assessment:                           - The anesthesia plan was to use monitored                            anesthesia care (MAC).                           After obtaining informed consent, the colonoscope                            was passed under direct vision. Throughout the                            procedure, the patient's blood pressure, pulse, and                            oxygen saturations were monitored continuously. The                            [id number]-[id number] ([id number]) scope was introduced through                            the anus and advanced to the the cecum, identified                            by appendiceal orifice and ileocecal valve. The                            colonoscopy was performed without difficulty.  The                            patient tolerated the procedure well. The quality                            of the bowel preparation was evaluated using the                            BBPS [facility] Bowel Preparation Scale) with scores                            of: Right Colon = 2 (minor amount of residual                            staining, small fragments of stool  and/or opaque                            liquid, but mucosa seen well), Transverse Colon = 3                            (entire mucosa seen well with no residual staining,                            small fragments of stool or opaque liquid) and Left                            Colon = 3 (entire mucosa seen well with no residual                            staining, small fragments of stool or opaque                            liquid). The total BBPS score equals 8. The quality                            of the bowel preparation was good. Scope In: 9:16:15 AM Scope Out: 9:29:05 AM Scope Withdrawal Time: 0 hours 11 minutes 1 second  Total Procedure Duration: 0 hours 12 minutes 50 seconds  Findings:      The perianal and digital rectal examinations were normal.      Non-bleeding internal hemorrhoids were found during endoscopy.      Many small and large-mouthed diverticula were found in the entire colon.      Two sessile polyps were found in the cecum. The polyps were 5 to 7 mm in       size. These polyps were removed with a cold snare. Resection and       retrieval were complete.      Two sessile polyps were found in the ascending colon. The polyps were 4       to 6 mm in size. These polyps were removed with a cold snare. Resection       and retrieval were complete. Impression:               -  Non-bleeding internal hemorrhoids.                           - Diverticulosis in the entire examined colon.                           - Two 5 to 7 mm polyps in the cecum, removed with a                            cold snare. Resected and retrieved.                           - Two 4 to 6 mm polyps in the ascending colon,                            removed with a cold snare. Resected and retrieved. Moderate Sedation:      Per Anesthesia Care Recommendation:           - Patient has a contact number available for                            emergencies. The signs and symptoms of potential                             delayed complications were discussed with the                            patient. Return to normal activities tomorrow.                            Written discharge instructions were provided to the                            patient.                           - Resume previous diet.                           - Continue present medications.                           - Await pathology results.                           - Repeat colonoscopy in 5 years for surveillance.                           - Return to GI clinic PRN. Procedure Code(s):        --- Professional ---                           [phone], Colonoscopy, flexible; with removal of                            tumor(s), polyp(s),  or other lesion(s) by snare                            technique Diagnosis Code(s):        --- Professional ---                           Z86.010, Personal history of colonic polyps                           K64.8, Other hemorrhoids                           K63.5, Polyp of colon                           K57.30, Diverticulosis of large intestine without                            perforation or abscess without bleeding CPT copyright 2019 American Medical Association. All rights reserved. The codes documented in this report are preliminary and upon coder review may  be revised to meet current compliance requirements. Elon Alas. Abbey Chatters, Swisher Abbey Chatters, DO 11/26/2019 9:33:37 AM This report has been signed electronically. Number of Addenda: 0

## 2019-11-26 NOTE — Progress Notes (Signed)
Waiting for Dr.Carver to see patient at bedside.

## 2019-11-26 NOTE — Anesthesia Postprocedure Evaluation (Signed)
Anesthesia Post Note  Patient: Jason Singleton  Procedure(s) Performed: COLONOSCOPY WITH PROPOFOL (N/A ) POLYPECTOMY  Patient location during evaluation: PACU Anesthesia Type: General Level of consciousness: awake, oriented, awake and alert and patient cooperative Pain management: satisfactory to patient Vital Signs Assessment: post-procedure vital signs reviewed and stable Respiratory status: spontaneous breathing, respiratory function stable and nonlabored ventilation Cardiovascular status: stable Postop Assessment: no apparent nausea or vomiting Anesthetic complications: no   No complications documented.   Last Vitals:  Vitals:   11/26/19 0800  BP: (!) 149/83  Pulse: 80  Resp: 18  Temp: 36.4 C  SpO2: 98%    Last Pain:  Vitals:   11/26/19 0915  TempSrc:   PainSc: 0-No pain                 Brisa Auth

## 2019-11-26 NOTE — Transfer of Care (Signed)
Immediate Anesthesia Transfer of Care Note  Patient: Jason Singleton  Procedure(s) Performed: COLONOSCOPY WITH PROPOFOL (N/A ) POLYPECTOMY  Patient Location: PACU  Anesthesia Type:General  Level of Consciousness: awake, alert , oriented and patient cooperative  Airway & Oxygen Therapy: Patient Spontanous Breathing  Post-op Assessment: Report given to RN, Post -op Vital signs reviewed and stable and Patient moving all extremities X 4  Post vital signs: Reviewed and stable  Last Vitals:  Vitals Value Taken Time  BP    Temp    Pulse 72 11/26/19 0935  Resp    SpO2 98 % 11/26/19 0935  Vitals shown include unvalidated device data.  Last Pain:  Vitals:   11/26/19 0915  TempSrc:   PainSc: 0-No pain         Complications: No complications documented.

## 2019-11-26 NOTE — Discharge Instructions (Signed)
  Colonoscopy Discharge Instructions  Read the instructions outlined below and refer to this sheet in the next few weeks. These discharge instructions provide you with general information on caring for yourself after you leave the hospital. Your doctor may also give you specific instructions. While your treatment has been planned according to the most current medical practices available, unavoidable complications occasionally occur.   ACTIVITY  You may resume your regular activity, but move at a slower pace for the next 24 hours.   Take frequent rest periods for the next 24 hours.   Walking will help get rid of the air and reduce the bloated feeling in your belly (abdomen).   No driving for 24 hours (because of the medicine (anesthesia) used during the test).    Do not sign any important legal documents or operate any machinery for 24 hours (because of the anesthesia used during the test).  NUTRITION  Drink plenty of fluids.   You may resume your normal diet as instructed by your doctor.   Begin with a light meal and progress to your normal diet. Heavy or fried foods are harder to digest and may make you feel sick to your stomach (nauseated).   Avoid alcoholic beverages for 24 hours or as instructed.  MEDICATIONS  You may resume your normal medications unless your doctor tells you otherwise.  WHAT YOU CAN EXPECT TODAY  Some feelings of bloating in the abdomen.   Passage of more gas than usual.   Spotting of blood in your stool or on the toilet paper.  IF YOU HAD POLYPS REMOVED DURING THE COLONOSCOPY:  No aspirin products for 7 days or as instructed.   No alcohol for 7 days or as instructed.   Eat a soft diet for the next 24 hours.  FINDING OUT THE RESULTS OF YOUR TEST Not all test results are available during your visit. If your test results are not back during the visit, make an appointment with your caregiver to find out the results. Do not assume everything is normal if  you have not heard from your caregiver or the medical facility. It is important for you to follow up on all of your test results.  SEEK IMMEDIATE MEDICAL ATTENTION IF:  You have more than a spotting of blood in your stool.   Your belly is swollen (abdominal distention).   You are nauseated or vomiting.   You have a temperature over 101.   You have abdominal pain or discomfort that is severe or gets worse throughout the day.   Your colonoscopy revealed 4 polyps which I removed successfully.  Please await your pathology results, my office will contact you.  You also have internal hemorrhoids as well as extensive diverticulosis.  I recommend increasing fiber in your diet or adding over-the-counter Benefiber/Metamucil.  I recommend that we repeat colonoscopy in 5 years.  Follow-up with GI as needed.  I hope you have a great rest of your week!  Elon Alas. Abbey Chatters, D.O. Gastroenterology and Hepatology [facility]

## 2019-11-26 NOTE — H&P (Signed)
Primary Care Physician:  Lucia Gaskins, MD Primary Gastroenterologist:  Dr. Abbey Chatters  Pre-Procedure History & Physical: HPI:  Jason Singleton is a 79 y.o. male is here for a surveillamnce colonoscopy.   Past Medical History:  Diagnosis Date   Hypothyroidism    Interstitial cystitis    Pelvic floor dysfunction    Primary snoring 06/06/2013   Patient's relatives witnessed thunderous snoring, and pauses in breathing during sleep. 04-2013     Past Surgical History:  Procedure Laterality Date   COLONOSCOPY  2011   SLF: 1. Rare ascending colon diverticula. Rare descending colon diverticula. Frequent sigmoid colon diverticula. The diverticula wer associated. with hypertrophy folds. Otherwise no polyps, masses, AVMs or inflammatory changes. 2. Small internal hemorrhoids. Otherwise, normal retroflexed view of the rectum.    COLONOSCOPY N/A 07/22/2014   SIMPLE ADENOMA(2), MOD IH, L TICS   Interstim generator placement  2017   MOUTH SURGERY  2010   VASECTOMY  1970    Prior to Admission medications   Medication Sig Start Date End Date Taking? Authorizing Provider  aspirin EC 81 MG tablet Take 81 mg by mouth daily. Swallow whole.   Yes [provider]  cholecalciferol (VITAMIN D3) 25 MCG (1000 UNIT) tablet Take 1,000 Units by mouth daily.   Yes [provider]  levothyroxine (SYNTHROID, LEVOTHROID) 75 MCG tablet Take 75 mcg by mouth daily before breakfast.  04/20/13  Yes [provider]  melatonin 5 MG TABS Take 20 mg by mouth at bedtime.    Yes [provider]  Multiple Vitamin (MULTIVITAMIN WITH MINERALS) TABS tablet Take 1 tablet by mouth daily.   Yes [provider]    Allergies as of 10/14/2019 - Review Complete 10/04/2019  Allergen Reaction Noted   Penicillins Swelling 06/06/2013   Sulfa antibiotics Rash 06/06/2013    Family History  Problem Relation Age of Onset   Pancreatic cancer Mother    Heart attack Mother    Stroke  Father    Heart attack Father    Celiac disease Sister    Celiac disease Sister    Celiac disease Brother    Colon cancer Sister        age greater than 34    Social History   Socioeconomic History   Marital status: Married    Spouse name: [name]   Number of children: 4   Years of education: Masters +   Highest education level: Not on file  Occupational History   Not on file  Tobacco Use   Smoking status: Former Smoker   Smokeless tobacco: Never Used   Tobacco comment: Quit in 1967  Substance and Sexual Activity   Alcohol use: Yes    Comment: 3 glasses of wine weekly   Drug use: No    Comment: Marijuana in the past   Sexual activity: Not on file  Other Topics Concern   Not on file  Social History Narrative   Patient is married [name]) and lives at home with his wife, daughter, son-in-law and grandson.   Patient is retired.   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 (Three children are adopted)   Patient drinks 3/4 cups of coffee daily.         Social Determinants of Health   Financial Resource Strain:    Difficulty of Paying Living Expenses: Not on file  Food Insecurity:    Worried About [place] in the  Last Year: Not on file   Ran Out of Food in the Last Year: Not on file  Transportation Needs:    Lack of Transportation (Medical): Not on file   Lack of Transportation (Non-Medical): Not on file  Physical Activity:    Days of Exercise per Week: Not on file   Minutes of Exercise per Session: Not on file  Stress:    Feeling of Stress : Not on file  Social Connections:    Frequency of Communication with Friends and Family: Not on file   Frequency of Social Gatherings with Friends and Family: Not on file   Attends Religious Services: Not on file   Active Member of Clubs or Organizations: Not on file   Attends Archivist Meetings: Not on file   Marital Status:  Not on file  Intimate Partner Violence:    Fear of Current or Ex-Partner: Not on file   Emotionally Abused: Not on file   Physically Abused: Not on file   Sexually Abused: Not on file    Review of Systems: See HPI, otherwise negative ROS  Impression/Plan: [name] is here for a colonoscopy to be performed for surveillance due to history of adenomatous polyps.  Risks, benefits, limitations, imponderables and alternatives regarding colonoscopy have been reviewed with the patient. Questions have been answered. All parties agreeable.

## 2019-11-27 LAB — SURGICAL PATHOLOGY

## 2019-12-02 ENCOUNTER — Encounter (HOSPITAL_COMMUNITY): Payer: Self-pay | Admitting: Internal Medicine

## 2020-05-15 ENCOUNTER — Other Ambulatory Visit (HOSPITAL_COMMUNITY): Payer: Self-pay | Admitting: Family Medicine

## 2020-05-15 ENCOUNTER — Other Ambulatory Visit: Payer: Self-pay

## 2020-05-15 ENCOUNTER — Ambulatory Visit (HOSPITAL_COMMUNITY)
Admission: RE | Admit: 2020-05-15 | Discharge: 2020-05-15 | Disposition: A | Discharge status: Home / Self Care | Payer: Medicare PPO | Source: Ambulatory Visit | Attending: Family Medicine | Admitting: Family Medicine

## 2020-05-15 DIAGNOSIS — M25511 Pain in right shoulder: Secondary | ICD-10-CM

## 2021-05-12 IMAGING — DX DG ANKLE COMPLETE 3+V*R*
3 series · 3 of 3 positions shown · non-contrast
Comparison: None.

CLINICAL DATA: Lateral foot pain for 1 month. No known injury.

EXAM:
RIGHT ANKLE - COMPLETE 3+ VIEW

[ankle ap]
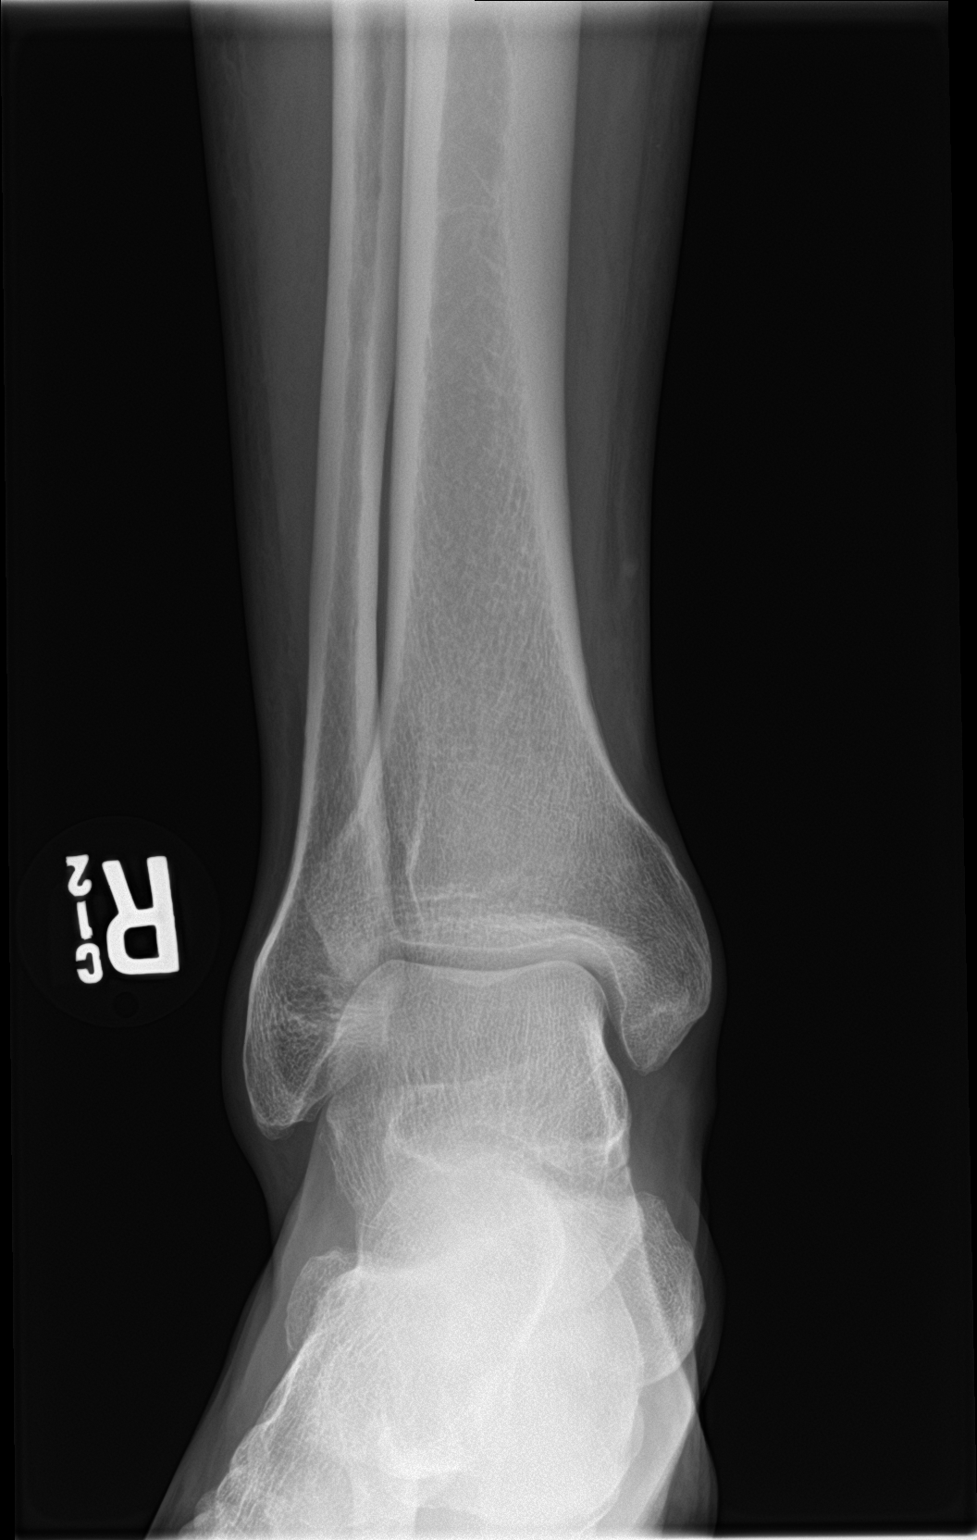

[ankle obl]
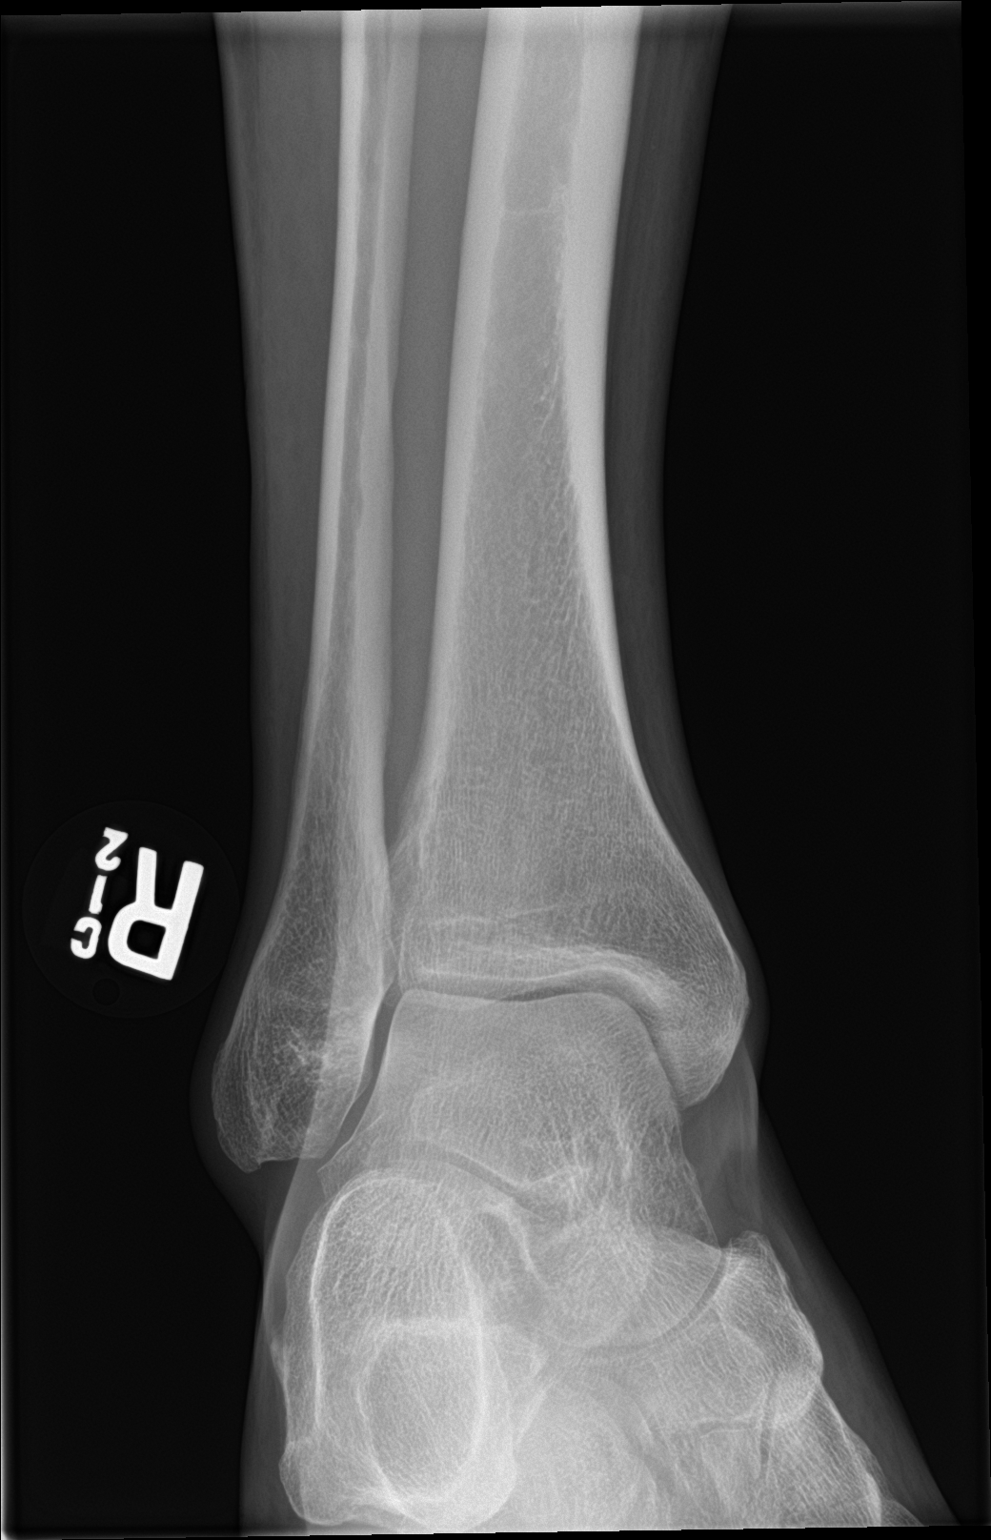

[ankle lat]
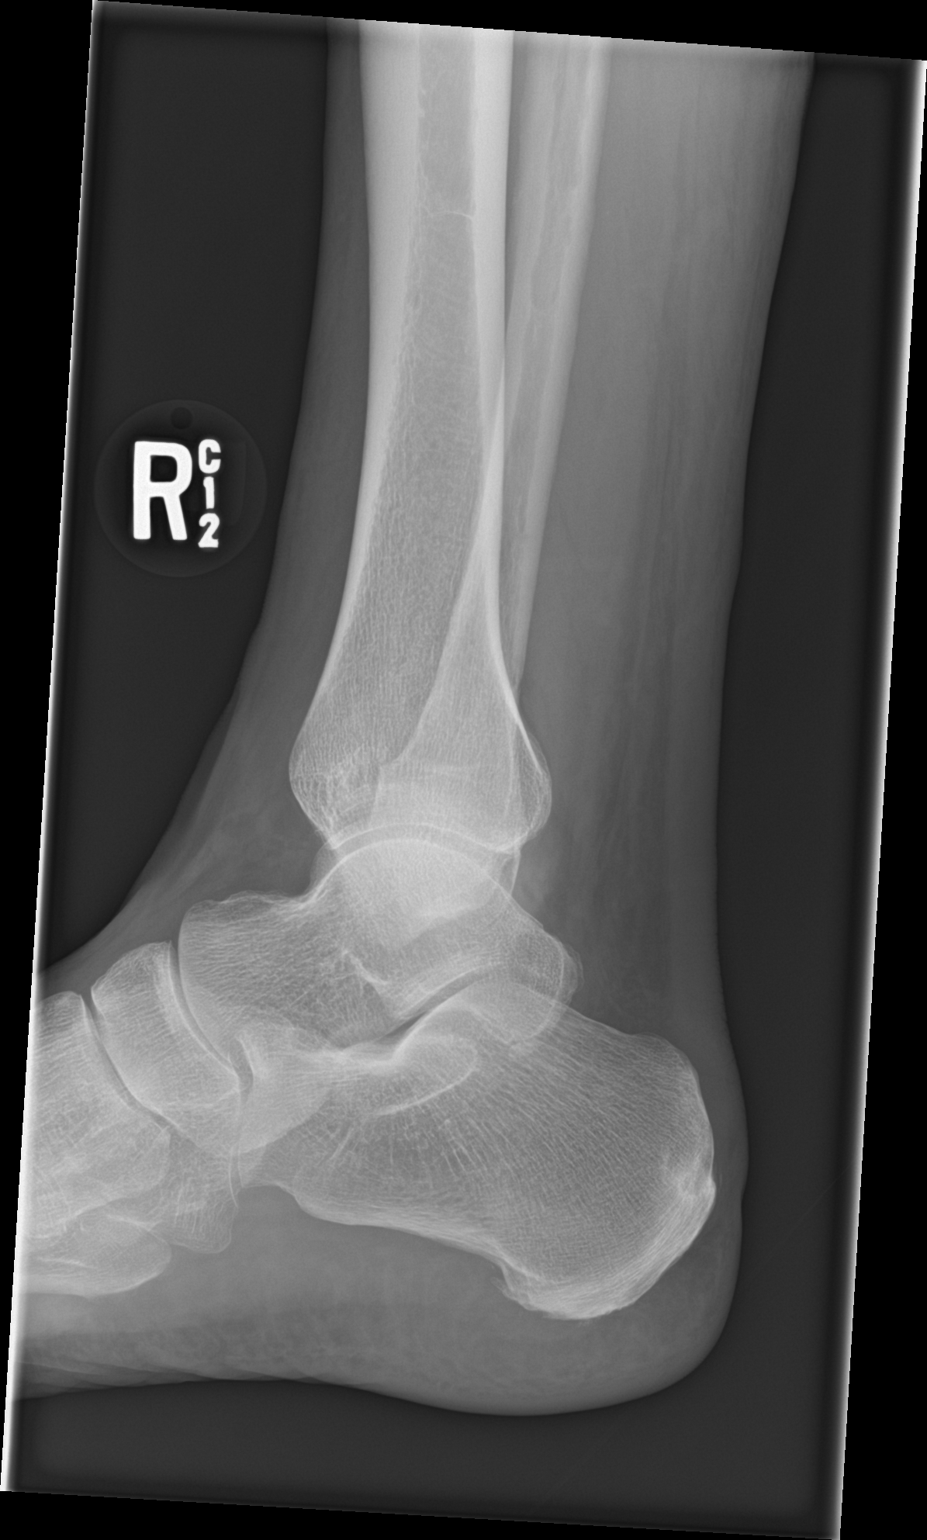

[3 of 3 positions shown; findings below may reference images not displayed]

FINDINGS: The mineralization and alignment are normal. There is no evidence of
acute fracture or dislocation. The joint spaces are preserved. The
soft tissues appear unremarkable.
IMPRESSION: Normal examination.

## 2021-05-12 IMAGING — DX DG FOOT COMPLETE 3+V*R*
3 series · 3 of 3 positions shown · non-contrast
Comparison: None.

CLINICAL DATA: Lateral foot pain for 1 month. No known injury.

EXAM:
RIGHT FOOT COMPLETE - 3+ VIEW

[foot ap]
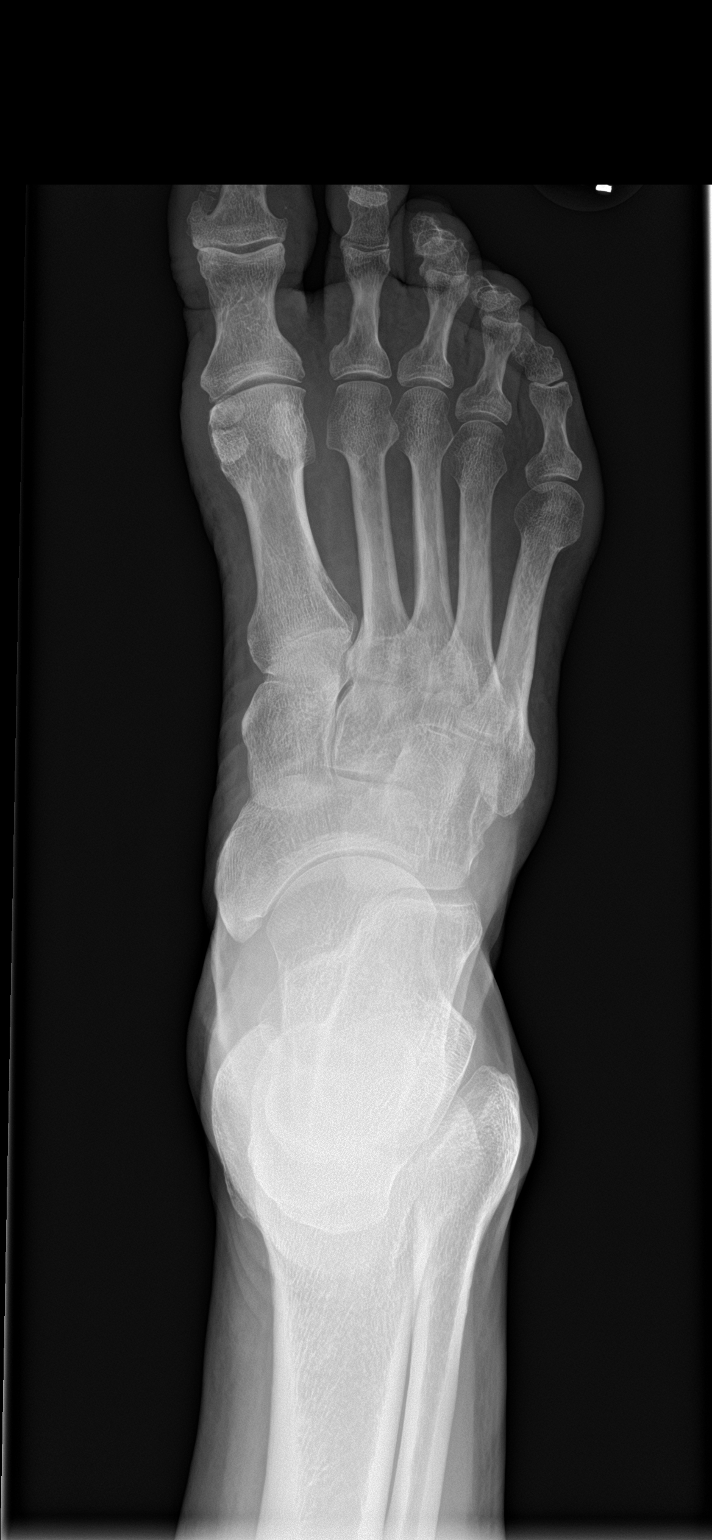

[foot obl]
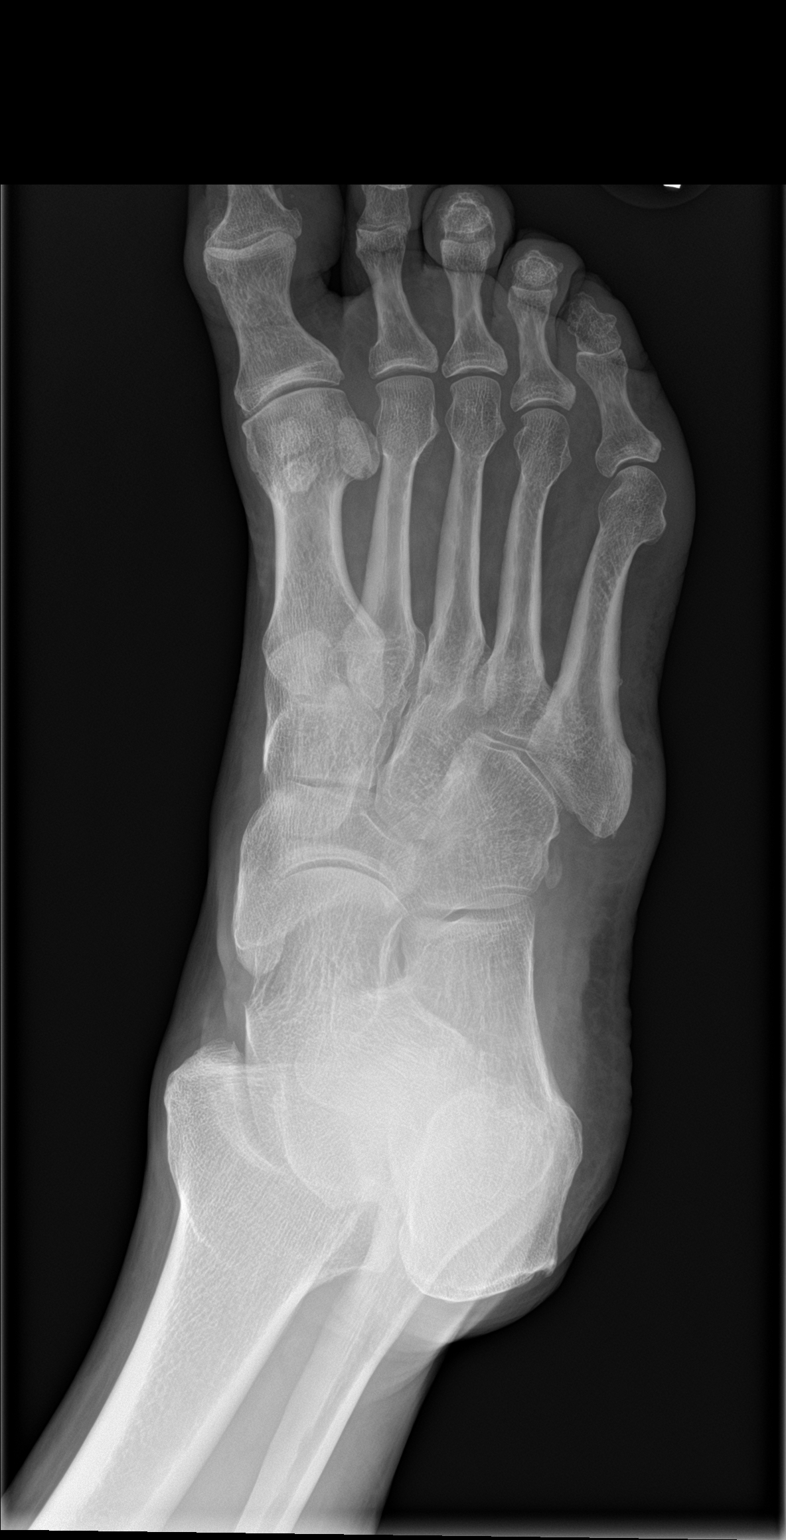

[foot lat]
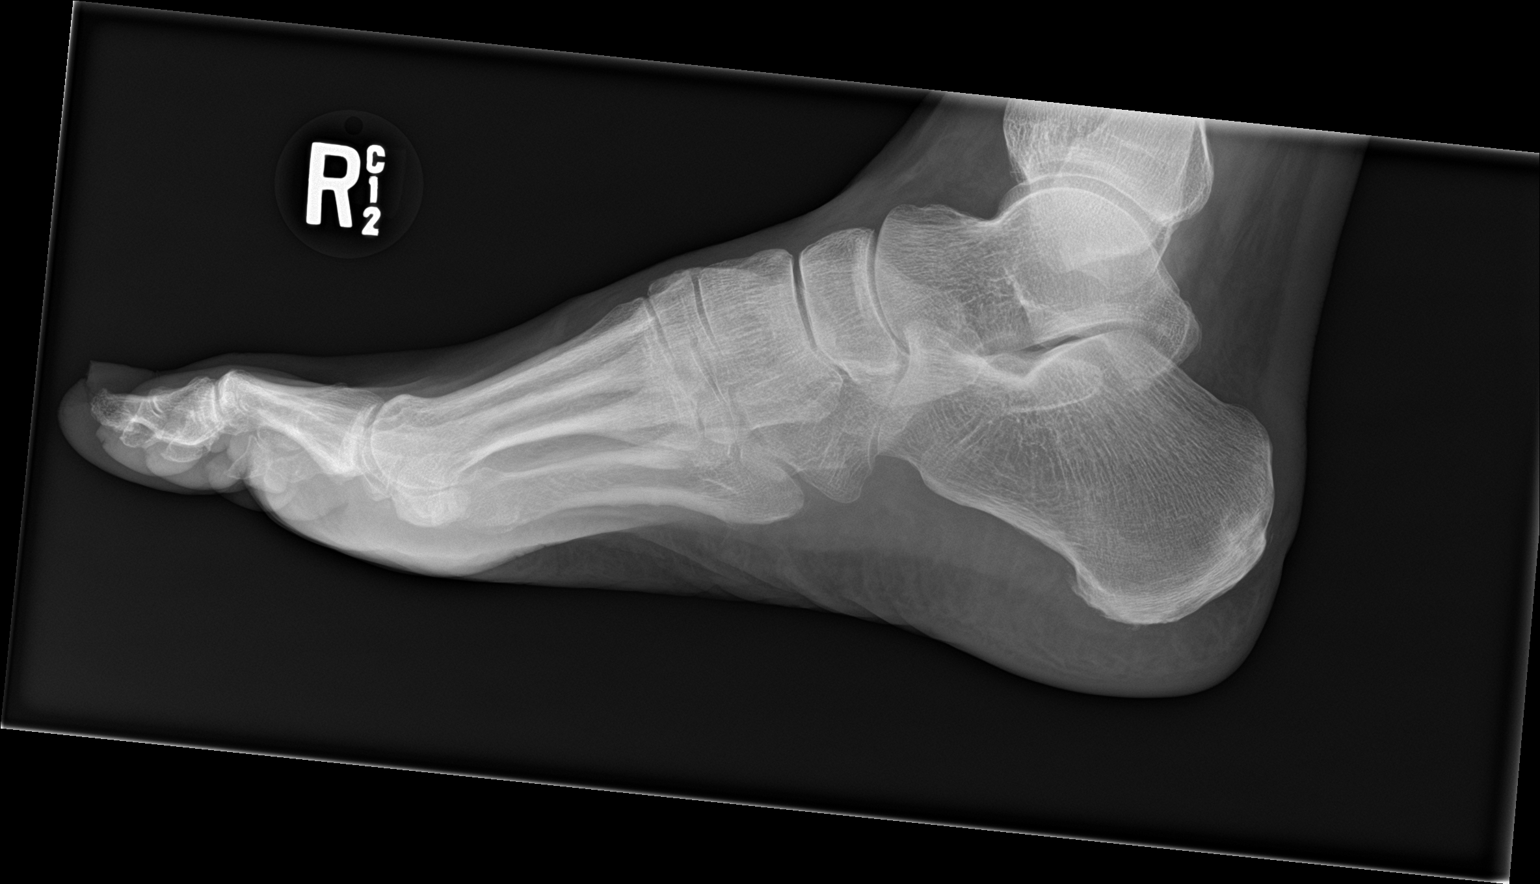

[3 of 3 positions shown; findings below may reference images not displayed]

FINDINGS: The mineralization and alignment are normal. There is no evidence of
acute fracture or dislocation. Minimal joint space narrowing at the
1st metatarsophalangeal joint. The tibial sesamoid of the 1st
metatarsal is bipartite. No erosive changes. Possible mild soft
tissue swelling over the base of the 5th metatarsal without
associated foreign body.
IMPRESSION: Possible soft tissue swelling over the base of the 5th metatarsal
without acute osseous findings.

## 2021-06-08 ENCOUNTER — Other Ambulatory Visit (HOSPITAL_COMMUNITY): Payer: Self-pay | Admitting: Family Medicine

## 2021-06-08 ENCOUNTER — Other Ambulatory Visit: Payer: Self-pay | Admitting: Family Medicine

## 2021-06-08 DIAGNOSIS — R279 Unspecified lack of coordination: Secondary | ICD-10-CM

## 2021-07-01 ENCOUNTER — Encounter (HOSPITAL_COMMUNITY): Payer: Self-pay

## 2021-07-01 ENCOUNTER — Ambulatory Visit (HOSPITAL_COMMUNITY)
Admission: RE | Admit: 2021-07-01 | Discharge: 2021-07-01 | Disposition: A | Discharge status: Home / Self Care | Payer: Medicare PPO | Source: Ambulatory Visit | Attending: Family Medicine | Admitting: Family Medicine

## 2021-07-01 DIAGNOSIS — R279 Unspecified lack of coordination: Secondary | ICD-10-CM

## 2021-07-02 ENCOUNTER — Other Ambulatory Visit (HOSPITAL_COMMUNITY): Payer: Self-pay | Admitting: Family Medicine

## 2021-07-02 ENCOUNTER — Other Ambulatory Visit: Payer: Self-pay | Admitting: Family Medicine

## 2021-07-02 DIAGNOSIS — R279 Unspecified lack of coordination: Secondary | ICD-10-CM

## 2021-08-11 ENCOUNTER — Ambulatory Visit (HOSPITAL_COMMUNITY)
Admission: RE | Admit: 2021-08-11 | Discharge: 2021-08-11 | Disposition: A | Discharge status: Home / Self Care | Payer: Medicare PPO | Source: Ambulatory Visit | Attending: Family Medicine | Admitting: Family Medicine

## 2021-08-11 ENCOUNTER — Encounter (HOSPITAL_COMMUNITY): Payer: Self-pay | Admitting: Radiology

## 2021-08-11 DIAGNOSIS — R279 Unspecified lack of coordination: Secondary | ICD-10-CM | POA: Diagnosis present

## 2021-08-11 MED ORDER — IOHEXOL 300 MG/ML  SOLN
100.0000 mL | Freq: Once | INTRAMUSCULAR | Status: AC | PRN
  Administered 2021-08-11: 75 mL via INTRAVENOUS

## 2021-10-06 ENCOUNTER — Ambulatory Visit: Payer: Medicare PPO | Admitting: Neurology

## 2021-10-06 ENCOUNTER — Encounter: Payer: Self-pay | Admitting: Neurology

## 2021-10-06 VITALS — BP 142/72 | HR 74 | Ht 69.0 in | Wt 173.5 lb

## 2021-10-06 DIAGNOSIS — G309 Alzheimer's disease, unspecified: Secondary | ICD-10-CM

## 2021-10-06 DIAGNOSIS — G3184 Mild cognitive impairment, so stated: Secondary | ICD-10-CM

## 2021-10-06 NOTE — Progress Notes (Addendum)
S[facility]EP [facility]INE [facility]IC    Provider:  Larey Seat, MD  Primary Care Physician:  Celene Squibb, MD [place] [address]     Referring Provider: Cecile Sheerer, [address] [place],  [place] [id number]          Chief Complaint according to patient   Patient presents with:     New Patient (Initial Visit)      Pt referred for eval for coordination problem and weakness. On April 2nd pt reports having problems w balance, slurred speech, and difficulty walking.   Pt has concerns :memory. Not remembering fully some of the most important life events. MOCA:27/ 30.       HISTORY OF PRESENT ILLNESS:  Jason Singleton is a 81 y.o.  Caucasian male patient seen here upon referral on 10/06/2021 from Dr Nevada Crane, upon urging of his daughter . He lives with daughter, son-in-law and wife, who is 38 years-old and has dementia. Chief concern according to patient :  I am having memory issues. I had a spell of transient ataxia, inability to walk unassisted.  Upon a week later - he had to call EMS for his wife upon becoming unresponsive , he joined her to the hospital and asked when he will finally see the MD- he was told he had spoken to 2 of them already, and had no recollection of this ! He is worried for these amnestic spells and his brother suffers with frontotemporal dementia, adding to his concern.  My sisters died last months in her 16 th year. I drove to her funeral in [place] alone by car, and had no problems. He runs the household alone, laundry, shopping."      Jason Singleton  has a past medical history of Hypothyroidism, Interstitial cystitis, Pelvic floor dysfunction, and Primary snoring (06/06/2013).     Family medical history: wife is suffering from dementia -3 step , one step daughter  is dyslexic.  biological daughter , Asperger in a grandson.  other family member on CPAP with OSA, insomnia, sleep walkers.    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  Retired again 2011.  Tobacco use: 1967.  ETOH use : wine 3 / week,  Caffeine intake in form of Coffee( 1 cup in AM ) Soda( /) Tea ( /) or energy drinks.    Sleep habits are as follows: The patient's dinner time is between 5 PM. The patient goes to bed at 10 PM and continues to sleep for 7 hours.He reports not feeling refreshed or restored in AM.   Review of Systems: Out of a complete 14 system review, the patient complains of only the following symptoms, and all other reviewed systems are negative.:   Social History   Socioeconomic History   Marital status: Married    Spouse name: [name]   Number of children: 4   Years of education: Masters +   Highest education level: Not on file  Occupational History   Not on file  Tobacco Use   Smoking status: Former    Types: Cigarettes    Quit date: 1967    Years since quitting: 40.6   Smokeless tobacco: Never   Tobacco comments:    Quit in 1967  Substance and Sexual Activity   Alcohol use: Yes    Comment: 3  glasses of wine weekly   Drug use: No    Comment: Marijuana in the past   Sexual activity: Not on file  Other Topics Concern   Not on file  Social History Narrative   Patient is married [name]) and lives at home with his wife, daughter, son-in-law    Patient is retired.Patient has a Restaurant manager, 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 (Three children are adopted)   R handed   Patient drinks 3/4 cups of coffee daily.   Social Determinants of Health   Financial Resource Strain: Not on file  Food Insecurity: Not on file  Transportation Needs: Not on file  Physical Activity: Not on file  Stress: Not on file  Social Connections: Not on file    Family History  Problem Relation Age of Onset   Pancreatic cancer Mother    Heart attack Mother    Stroke Father    Heart attack Father    Celiac disease  Sister    Celiac disease Sister    Colon cancer Sister        age greater than 76   Celiac disease Brother    Frontotemporal dementia Brother     Past Medical History:  Diagnosis Date   Hypothyroidism    Interstitial cystitis    Pelvic floor dysfunction    Primary snoring 06/06/2013   Patient's relatives witnessed thunderous snoring, and pauses in breathing during sleep. 04-2013     Past Surgical History:  Procedure Laterality Date   COLONOSCOPY  2011   SLF: 1. Rare ascending colon diverticula. Rare descending colon diverticula. Frequent sigmoid colon diverticula. The diverticula wer associated. with hypertrophy folds. Otherwise no polyps, masses, AVMs or inflammatory changes. 2. Small internal hemorrhoids. Otherwise, normal retroflexed view of the rectum.    COLONOSCOPY N/A 07/22/2014   SIMPLE ADENOMA(2), MOD IH, L TICS   COLONOSCOPY WITH PROPOFOL N/A 11/26/2019   Procedure: COLONOSCOPY WITH PROPOFOL;  Surgeon: Eloise Harman, DO;  Location: [facility];  Service: Endoscopy;  Laterality: N/A;  9:00am   Interstim generator placement  2017   MOUTH SURGERY  2010   POLYPECTOMY  11/26/2019   Procedure: POLYPECTOMY;  Surgeon: Eloise Harman, DO;  Location: [facility];  Service: Endoscopy;;   VASECTOMY  1970     Current Outpatient Medications on File Prior to Visit  Medication Sig Dispense Refill   aspirin EC 81 MG tablet Take 81 mg by mouth daily. Swallow whole.     cholecalciferol (VITAMIN D3) 25 MCG (1000 UNIT) tablet Take 1,000 Units by mouth daily.     levothyroxine (SYNTHROID, LEVOTHROID) 75 MCG tablet Take 75 mcg by mouth daily before breakfast.      melatonin 5 MG TABS Take 20 mg by mouth at bedtime.      Multiple Vitamin (MULTIVITAMIN WITH MINERALS) TABS tablet Take 1 tablet by mouth daily.     rosuvastatin (CRESTOR) 10 MG tablet Take 10 mg by mouth daily.     No current facility-administered medications on file prior to visit.    Allergies  Allergen Reactions    Penicillins Swelling    "major swelling at injection site as a teenager"   Sulfa Antibiotics Rash    Physical exam:  Today's Vitals   10/06/21 1358  BP: (!) 142/72  Pulse: 74  Weight: 173 lb 8 oz (78.7 kg)  Height: '5\' 9"'$  (1.753 m)   Body mass index is 25.62 kg/m.   Wt  Readings from Last 3 Encounters:  10/06/21 173 lb 8 oz (78.7 kg)  11/26/19 165 lb (74.8 kg)  10/04/19 177 lb 3.2 oz (80.4 kg)     Ht Readings from Last 3 Encounters:  10/06/21 '5\' 9"'$  (1.753 m)  11/26/19 '5\' 9"'$  (1.753 m)  10/04/19 '5\' 10"'$  (1.778 m)      General: The patient is awake, alert and appears not in acute distress. The patient is well groomed. Head: Normocephalic, atraumatic. Neck is supple. Mallampati 2,  neck circumference:17.5 inches .  Retrognathia is  seen.  Dental status: biological  Cardiovascular:  Regular rate and cardiac rhythm by pulse,  without distended neck veins. Respiratory: Lungs are clear to auscultation.  Skin:  Without evidence of ankle edema, or rash. Trunk: The patient's posture is erect.   Neurologic exam : The patient is awake and alert, oriented to place and time.   Memory subjective described as intact.  Attention span & concentration ability appears normal.  Speech is fluent,  without  dysarthria, dysphonia or aphasia.  Mood and affect are appropriate.     10/06/2021    2:13 PM  Montreal Cognitive Assessment   Visuospatial/ Executive (0/5) 5  Naming (0/3) 3  Attention: Read list of digits (0/2) 2  Attention: Read list of letters (0/1) 1  Attention: Serial 7 subtraction starting at 100 (0/3) 3  Language: Repeat phrase (0/2) 2  Language : Fluency (0/1) 1  Abstraction (0/2) 2  Delayed Recall (0/5) 2  Orientation (0/6) 6  Total 27      Cranial nerves: no loss of smell or taste reported  Pupils are equal and briskly reactive to light. Funduscopic exam deferred.  Extraocular movements in vertical and horizontal planes were intact and without nystagmus. No  Diplopia. Visual fields by finger perimetry are intact. Hearing was intact to soft voice and finger rubbing.    Facial sensation intact to fine touch.  Facial motor strength is symmetric and tongue and uvula move midline.  Neck ROM : rotation, tilt and flexion extension were normal for age and shoulder shrug was symmetrical.    Motor exam:  Symmetric bulk, tone and ROM.   Normal tone without cog wheeling, symmetric grip strength .   Sensory:  Fine touch, pinprick and vibration were tested  and  normal.  Proprioception tested in the upper extremities was normal.   Coordination: Rapid alternating movements in the fingers/hands were of normal speed.  The Finger-to-nose maneuver was intact without evidence of ataxia, dysmetria but mild action tremor.   Gait and station: Patient could rise unassisted from a seated position, walked without assistive device.    Deep tendon reflexes: in the  upper and lower extremities are symmetric and intact.  Babinski response was deferred .       After spending a total time of  55  minutes face to face and additional time for physical and neurologic examination, review of laboratory studies,  personal review of imaging studies, reports and results of other testing and review of referral information / records as far as provided in visit, I have established the following assessments:  Only abnormal lab was elevated Hba1c.   1) I have the pleasure of seeing Mr. Soler today for evaluation of a memory disorder concerning is here that he has on the MoCA test a very good score of 27 out of 30 which is considered normal he missed however all 3 items in the delayed recall category.  This alone would not concern  me very much with the not be the history of amnestic events of having had conversations or detailed information and having for completely forgotten that those took place.  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 Nevada Crane did a panel already his comprehensive metabolic panel CBC with differential thyroid panel thyroid panel vitamin B12 were all reviewed and in normal limits the patient's only abnormality was an elevated hemoglobin A1c.  An MRI of the brain was ordered with and without contrast but could not be performed because the patient Is not INTERSTIM  implant.     My goal is to have the patient evaluated by a neuropsychologist for more detailed memory test #1 #2 I will draw a bosselated Tylenol level #3 I like for the patient to undergo a PET scan and I will have to confirm with my radiology colleagues that this is possible with an implant= to my knowledge it is.   I would like to thank Celene Squibb, MD and Cecile Sheerer, [address] [place],  [place] [address] for allowing me to meet with and to take care of this pleasant patient.   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     I plan to follow up either personally or through our NP within 3-4 monthS.   CC: I will share my notes with PCP.  Electronically signed by: Larey Seat, MD 10/06/2021 2:30 PM  [facility] Board certified by The [employer] of Sleep Medicine and Diplomate of the [employer] of Sleep Medicine. Board certified In Neurology through the [place], Fellow of the [employer] of Neurology. Medical Director of [facility].

## 2021-10-07 ENCOUNTER — Telehealth: Payer: Self-pay | Admitting: Neurology

## 2021-10-07 NOTE — Telephone Encounter (Signed)
Referral sent to [facility] [phone]. ?

## 2021-10-12 ENCOUNTER — Telehealth: Payer: Self-pay | Admitting: Neurology

## 2021-10-12 ENCOUNTER — Other Ambulatory Visit (HOSPITAL_COMMUNITY): Payer: Self-pay | Admitting: Neurology

## 2021-10-12 DIAGNOSIS — G309 Alzheimer's disease, unspecified: Secondary | ICD-10-CM

## 2021-10-12 NOTE — Telephone Encounter (Signed)
[employer]are Josem Kaufmann: [id number] exp. 10/12/21-11/11/21 sent to [facility]

## 2021-10-13 ENCOUNTER — Telehealth: Payer: Self-pay | Admitting: *Deleted

## 2021-10-13 NOTE — Telephone Encounter (Signed)
Called and spoke w/ pt about lab results per Dr. Edwena Felty note. He verbalized understanding. Advised PET scan approved and order sent to [facility]. They should be reaching out soon to him to get that scheduled.

## 2021-10-13 NOTE — Telephone Encounter (Signed)
-----   Message from Larey Seat, MD sent at 10/13/2021 12:28 PM EDT ----- So far all labs look normal, I am awaiting result if PET scan can be performed instead of a MRI.

## 2021-10-13 NOTE — Progress Notes (Signed)
So far all labs look normal, I am awaiting result if PET scan can be performed instead of a MRI.

## 2021-11-09 DIAGNOSIS — R413 Other amnesia: Secondary | ICD-10-CM | POA: Diagnosis not present

## 2021-11-18 ENCOUNTER — Other Ambulatory Visit (HOSPITAL_COMMUNITY): Payer: Medicare PPO

## 2021-11-18 ENCOUNTER — Encounter (HOSPITAL_COMMUNITY)
Admission: RE | Admit: 2021-11-18 | Discharge: 2021-11-18 | Disposition: A | Discharge status: Home / Self Care | Payer: Medicare PPO | Source: Ambulatory Visit | Attending: Neurology | Admitting: Neurology

## 2021-11-18 DIAGNOSIS — G309 Alzheimer's disease, unspecified: Secondary | ICD-10-CM | POA: Insufficient documentation

## 2021-11-18 MED ORDER — FLORBETAPIR F 18 500-1900 MBQ/ML IV SOLN
7.7700 | Freq: Once | INTRAVENOUS | Status: AC
  Administered 2021-11-18: 7.77 via INTRAVENOUS

## 2021-11-19 LAB — METHYLMALONIC ACID, SERUM: Methylmalonic Acid: 220 nmol/L (ref 0–378)

## 2021-11-19 LAB — PROTEIN ELECTROPHORESIS, SERUM
A/G Ratio: 1.2 (ref 0.7–1.7)
Albumin ELP: 3.8 g/dL (ref 2.9–4.4)
Alpha 1: 0.2 g/dL (ref 0.0–0.4)
Alpha 2: 0.7 g/dL (ref 0.4–1.0)
Beta: 1 g/dL (ref 0.7–1.3)
Gamma Globulin: 1.4 g/dL (ref 0.4–1.8)
Globulin, Total: 3.3 g/dL (ref 2.2–3.9)
Total Protein: 7.1 g/dL (ref 6.0–8.5)

## 2021-11-19 LAB — ANA W/REFLEX: Anti Nuclear Antibody (ANA): NEGATIVE

## 2021-11-19 LAB — HOMOCYSTEINE: Homocysteine: 13.4 umol/L (ref 0.0–21.3)

## 2021-11-19 LAB — SEDIMENTATION RATE: Sed Rate: 19 mm/hr (ref 0–30)

## 2021-11-19 LAB — HIV ANTIBODY (ROUTINE TESTING W REFLEX): HIV Screen 4th Generation wRfx: NONREACTIVE

## 2021-11-19 LAB — RPR: RPR Ser Ql: NONREACTIVE

## 2021-11-19 LAB — EARLY ONSET ALZHEIMER'S PANEL

## 2021-11-21 NOTE — Progress Notes (Signed)
All labs normal, including the amyloid brain scan ! Very good result-

## 2021-11-22 ENCOUNTER — Telehealth: Payer: Self-pay

## 2021-11-22 NOTE — Telephone Encounter (Signed)
-----   Message from Larey Seat, MD sent at 11/21/2021  5:04 PM EDT ----- All labs normal, including the amyloid brain scan ! Very good result-

## 2021-11-22 NOTE — Telephone Encounter (Signed)
-----   Message from Larey Seat, MD sent at 11/21/2021  4:37 PM EDT ----- Very good result!  no beta amyloid plaques were detected,  which is a result inconsistent with Alzheimer's Dementia or FTD

## 2021-11-22 NOTE — Telephone Encounter (Signed)
I called patient. I advised him of his brain PET scan results. I advised him that per Dr. Brett Fairy his las were normal as well.  I offered patient an appointment with Dr. Brett Fairy to follow up if he has further questions or concerns but patient declined at this time and will call us back if he decides that he would like this. Pt verbalized understanding of results. Pt had no questions at this time but was encouraged to call back if questions arise.

## 2021-11-25 DIAGNOSIS — R413 Other amnesia: Secondary | ICD-10-CM | POA: Diagnosis not present

## 2022-03-11 DIAGNOSIS — E039 Hypothyroidism, unspecified: Secondary | ICD-10-CM | POA: Diagnosis not present

## 2022-03-11 DIAGNOSIS — Z125 Encounter for screening for malignant neoplasm of prostate: Secondary | ICD-10-CM | POA: Diagnosis not present

## 2022-03-18 DIAGNOSIS — R809 Proteinuria, unspecified: Secondary | ICD-10-CM | POA: Diagnosis not present

## 2022-03-18 DIAGNOSIS — M5126 Other intervertebral disc displacement, lumbar region: Secondary | ICD-10-CM | POA: Diagnosis not present

## 2022-03-18 DIAGNOSIS — E785 Hyperlipidemia, unspecified: Secondary | ICD-10-CM | POA: Diagnosis not present

## 2022-03-18 DIAGNOSIS — M25562 Pain in left knee: Secondary | ICD-10-CM | POA: Diagnosis not present

## 2022-03-18 DIAGNOSIS — Z0000 Encounter for general adult medical examination without abnormal findings: Secondary | ICD-10-CM | POA: Diagnosis not present

## 2022-03-18 DIAGNOSIS — E039 Hypothyroidism, unspecified: Secondary | ICD-10-CM | POA: Diagnosis not present

## 2022-03-18 DIAGNOSIS — Z125 Encounter for screening for malignant neoplasm of prostate: Secondary | ICD-10-CM | POA: Diagnosis not present

## 2022-03-18 DIAGNOSIS — Z23 Encounter for immunization: Secondary | ICD-10-CM | POA: Diagnosis not present

## 2022-03-18 DIAGNOSIS — R279 Unspecified lack of coordination: Secondary | ICD-10-CM | POA: Diagnosis not present

## 2022-04-11 DIAGNOSIS — H5213 Myopia, bilateral: Secondary | ICD-10-CM | POA: Diagnosis not present

## 2022-07-29 DIAGNOSIS — Z818 Family history of other mental and behavioral disorders: Secondary | ICD-10-CM | POA: Diagnosis not present

## 2022-07-29 DIAGNOSIS — E039 Hypothyroidism, unspecified: Secondary | ICD-10-CM | POA: Diagnosis not present

## 2022-07-29 DIAGNOSIS — Z823 Family history of stroke: Secondary | ICD-10-CM | POA: Diagnosis not present

## 2022-07-29 DIAGNOSIS — N529 Male erectile dysfunction, unspecified: Secondary | ICD-10-CM | POA: Diagnosis not present

## 2022-07-29 DIAGNOSIS — Z87891 Personal history of nicotine dependence: Secondary | ICD-10-CM | POA: Diagnosis not present

## 2022-07-29 DIAGNOSIS — Z880 Allergy status to penicillin: Secondary | ICD-10-CM | POA: Diagnosis not present

## 2022-07-29 DIAGNOSIS — I10 Essential (primary) hypertension: Secondary | ICD-10-CM | POA: Diagnosis not present

## 2022-07-29 DIAGNOSIS — E785 Hyperlipidemia, unspecified: Secondary | ICD-10-CM | POA: Diagnosis not present

## 2022-07-29 DIAGNOSIS — Z8249 Family history of ischemic heart disease and other diseases of the circulatory system: Secondary | ICD-10-CM | POA: Diagnosis not present

## 2022-09-13 DIAGNOSIS — E785 Hyperlipidemia, unspecified: Secondary | ICD-10-CM | POA: Diagnosis not present

## 2022-09-13 DIAGNOSIS — Z125 Encounter for screening for malignant neoplasm of prostate: Secondary | ICD-10-CM | POA: Diagnosis not present

## 2022-09-13 DIAGNOSIS — E039 Hypothyroidism, unspecified: Secondary | ICD-10-CM | POA: Diagnosis not present

## 2022-09-16 DIAGNOSIS — M5126 Other intervertebral disc displacement, lumbar region: Secondary | ICD-10-CM | POA: Diagnosis not present

## 2022-09-16 DIAGNOSIS — E785 Hyperlipidemia, unspecified: Secondary | ICD-10-CM | POA: Diagnosis not present

## 2022-09-16 DIAGNOSIS — E039 Hypothyroidism, unspecified: Secondary | ICD-10-CM | POA: Diagnosis not present

## 2022-09-16 DIAGNOSIS — E663 Overweight: Secondary | ICD-10-CM | POA: Diagnosis not present

## 2022-09-16 DIAGNOSIS — M25562 Pain in left knee: Secondary | ICD-10-CM | POA: Diagnosis not present

## 2022-09-16 DIAGNOSIS — R809 Proteinuria, unspecified: Secondary | ICD-10-CM | POA: Diagnosis not present

## 2022-09-16 DIAGNOSIS — R7303 Prediabetes: Secondary | ICD-10-CM | POA: Diagnosis not present

## 2022-09-16 DIAGNOSIS — R279 Unspecified lack of coordination: Secondary | ICD-10-CM | POA: Diagnosis not present

## 2022-09-16 DIAGNOSIS — N289 Disorder of kidney and ureter, unspecified: Secondary | ICD-10-CM | POA: Diagnosis not present

## 2022-10-31 DIAGNOSIS — N3010 Interstitial cystitis (chronic) without hematuria: Secondary | ICD-10-CM | POA: Diagnosis not present

## 2022-10-31 DIAGNOSIS — N529 Male erectile dysfunction, unspecified: Secondary | ICD-10-CM | POA: Diagnosis not present

## 2023-03-16 DIAGNOSIS — E785 Hyperlipidemia, unspecified: Secondary | ICD-10-CM | POA: Diagnosis not present

## 2023-03-16 DIAGNOSIS — E039 Hypothyroidism, unspecified: Secondary | ICD-10-CM | POA: Diagnosis not present

## 2023-03-22 DIAGNOSIS — R279 Unspecified lack of coordination: Secondary | ICD-10-CM | POA: Diagnosis not present

## 2023-03-22 DIAGNOSIS — R809 Proteinuria, unspecified: Secondary | ICD-10-CM | POA: Diagnosis not present

## 2023-03-22 DIAGNOSIS — N289 Disorder of kidney and ureter, unspecified: Secondary | ICD-10-CM | POA: Diagnosis not present

## 2023-03-22 DIAGNOSIS — E663 Overweight: Secondary | ICD-10-CM | POA: Diagnosis not present

## 2023-03-22 DIAGNOSIS — M5126 Other intervertebral disc displacement, lumbar region: Secondary | ICD-10-CM | POA: Diagnosis not present

## 2023-03-22 DIAGNOSIS — E785 Hyperlipidemia, unspecified: Secondary | ICD-10-CM | POA: Diagnosis not present

## 2023-03-22 DIAGNOSIS — E039 Hypothyroidism, unspecified: Secondary | ICD-10-CM | POA: Diagnosis not present

## 2023-03-22 DIAGNOSIS — R7303 Prediabetes: Secondary | ICD-10-CM | POA: Diagnosis not present

## 2023-03-22 DIAGNOSIS — M25562 Pain in left knee: Secondary | ICD-10-CM | POA: Diagnosis not present

## 2023-09-13 DIAGNOSIS — E785 Hyperlipidemia, unspecified: Secondary | ICD-10-CM | POA: Diagnosis not present

## 2023-09-13 DIAGNOSIS — E039 Hypothyroidism, unspecified: Secondary | ICD-10-CM | POA: Diagnosis not present

## 2023-09-27 DIAGNOSIS — M5126 Other intervertebral disc displacement, lumbar region: Secondary | ICD-10-CM | POA: Diagnosis not present

## 2023-09-27 DIAGNOSIS — E663 Overweight: Secondary | ICD-10-CM | POA: Diagnosis not present

## 2023-09-27 DIAGNOSIS — R7303 Prediabetes: Secondary | ICD-10-CM | POA: Diagnosis not present

## 2023-09-27 DIAGNOSIS — Z6825 Body mass index (BMI) 25.0-25.9, adult: Secondary | ICD-10-CM | POA: Diagnosis not present

## 2023-09-27 DIAGNOSIS — N289 Disorder of kidney and ureter, unspecified: Secondary | ICD-10-CM | POA: Diagnosis not present

## 2023-09-27 DIAGNOSIS — M25562 Pain in left knee: Secondary | ICD-10-CM | POA: Diagnosis not present

## 2023-09-27 DIAGNOSIS — E785 Hyperlipidemia, unspecified: Secondary | ICD-10-CM | POA: Diagnosis not present

## 2023-09-27 DIAGNOSIS — E039 Hypothyroidism, unspecified: Secondary | ICD-10-CM | POA: Diagnosis not present

## 2023-09-27 DIAGNOSIS — Z713 Dietary counseling and surveillance: Secondary | ICD-10-CM | POA: Diagnosis not present

## 2023-10-05 ENCOUNTER — Telehealth: Payer: Self-pay

## 2023-10-05 NOTE — Telephone Encounter (Signed)
 Up to date on meds, next review in October

## 2023-10-31 DIAGNOSIS — N398 Other specified disorders of urinary system: Secondary | ICD-10-CM | POA: Diagnosis not present

## 2023-10-31 DIAGNOSIS — N3941 Urge incontinence: Secondary | ICD-10-CM | POA: Diagnosis not present

## 2023-10-31 DIAGNOSIS — Z01818 Encounter for other preprocedural examination: Secondary | ICD-10-CM | POA: Diagnosis not present

## 2023-10-31 DIAGNOSIS — Z789 Other specified health status: Secondary | ICD-10-CM | POA: Diagnosis not present

## 2023-10-31 DIAGNOSIS — Z9682 Presence of neurostimulator: Secondary | ICD-10-CM | POA: Diagnosis not present

## 2023-10-31 DIAGNOSIS — N3010 Interstitial cystitis (chronic) without hematuria: Secondary | ICD-10-CM | POA: Diagnosis not present

## 2023-11-16 DIAGNOSIS — Z23 Encounter for immunization: Secondary | ICD-10-CM | POA: Diagnosis not present

## 2023-11-27 DIAGNOSIS — M5412 Radiculopathy, cervical region: Secondary | ICD-10-CM | POA: Diagnosis not present

## 2023-11-27 DIAGNOSIS — M9901 Segmental and somatic dysfunction of cervical region: Secondary | ICD-10-CM | POA: Diagnosis not present

## 2023-11-29 ENCOUNTER — Other Ambulatory Visit (HOSPITAL_COMMUNITY): Payer: Self-pay | Admitting: Family Medicine

## 2023-11-29 DIAGNOSIS — M542 Cervicalgia: Secondary | ICD-10-CM | POA: Diagnosis not present

## 2023-11-30 ENCOUNTER — Encounter (HOSPITAL_COMMUNITY): Payer: Self-pay

## 2023-12-26 DIAGNOSIS — H43393 Other vitreous opacities, bilateral: Secondary | ICD-10-CM | POA: Diagnosis not present

## 2024-01-17 DIAGNOSIS — N3941 Urge incontinence: Secondary | ICD-10-CM | POA: Diagnosis not present

## 2024-02-09 DIAGNOSIS — N3281 Overactive bladder: Secondary | ICD-10-CM | POA: Diagnosis not present

## 2024-02-09 DIAGNOSIS — N3941 Urge incontinence: Secondary | ICD-10-CM | POA: Diagnosis not present
